# Patient Record
Sex: Male | Born: 1980
Health system: Southern US, Community
[De-identification: ages and names within clinical notes are randomized; demographics above are authoritative.]

## PROBLEM LIST (undated history)

## (undated) DIAGNOSIS — M1990 Unspecified osteoarthritis, unspecified site: Secondary | ICD-10-CM

## (undated) DIAGNOSIS — E785 Hyperlipidemia, unspecified: Secondary | ICD-10-CM

## (undated) DIAGNOSIS — I10 Essential (primary) hypertension: Secondary | ICD-10-CM

## (undated) DIAGNOSIS — L709 Acne, unspecified: Secondary | ICD-10-CM

## (undated) HISTORY — PX: ANKLE SURGERY: SHX546

## (undated) HISTORY — DX: Essential (primary) hypertension: I10

## (undated) HISTORY — DX: Hyperlipidemia, unspecified: E78.5

## (undated) HISTORY — DX: Acne, unspecified: L70.9

---
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
## 2003-12-07 ENCOUNTER — Emergency Department (HOSPITAL_COMMUNITY): Admission: EM | Admit: 2003-12-07 | Discharge: 2003-12-07 | Payer: Self-pay | Admitting: Emergency Medicine

## 2008-05-17 ENCOUNTER — Emergency Department (HOSPITAL_COMMUNITY): Admission: EM | Admit: 2008-05-17 | Discharge: 2008-05-17 | Payer: Self-pay | Admitting: Emergency Medicine

## 2012-10-23 ENCOUNTER — Emergency Department (HOSPITAL_COMMUNITY)
Admission: EM | Admit: 2012-10-23 | Discharge: 2012-10-23 | Disposition: A | Discharge status: Home / Self Care | Payer: BC Managed Care – PPO | Attending: Emergency Medicine | Admitting: Emergency Medicine

## 2012-10-23 ENCOUNTER — Encounter (HOSPITAL_COMMUNITY): Payer: Self-pay | Admitting: *Deleted

## 2012-10-23 DIAGNOSIS — IMO0002 Reserved for concepts with insufficient information to code with codable children: Secondary | ICD-10-CM | POA: Insufficient documentation

## 2012-10-23 DIAGNOSIS — F17200 Nicotine dependence, unspecified, uncomplicated: Secondary | ICD-10-CM | POA: Insufficient documentation

## 2012-10-23 DIAGNOSIS — E669 Obesity, unspecified: Secondary | ICD-10-CM | POA: Insufficient documentation

## 2012-10-23 DIAGNOSIS — Z8739 Personal history of other diseases of the musculoskeletal system and connective tissue: Secondary | ICD-10-CM | POA: Insufficient documentation

## 2012-10-23 DIAGNOSIS — M5416 Radiculopathy, lumbar region: Secondary | ICD-10-CM

## 2012-10-23 HISTORY — DX: Unspecified osteoarthritis, unspecified site: M19.90

## 2012-10-23 MED ORDER — CYCLOBENZAPRINE HCL 10 MG PO TABS: 10.0000 mg | ORAL_TABLET | Freq: Two times a day (BID) | ORAL | Status: DC | PRN

## 2012-10-23 MED ORDER — TRAMADOL HCL 50 MG PO TABS: 50.0000 mg | ORAL_TABLET | Freq: Four times a day (QID) | ORAL | Status: DC | PRN

## 2012-10-23 MED ORDER — NAPROXEN 500 MG PO TABS: 500.0000 mg | ORAL_TABLET | Freq: Two times a day (BID) | ORAL | Status: DC

## 2012-10-23 NOTE — ED Notes (Signed)
Pt states Thursday at work started having lower back pain radiating down to R thigh, pt states he does a lot of bending at work and thinks it triggered it, denies injury though, states fine over weekend then when went back to work today started having same pain.

## 2012-10-23 NOTE — ED Provider Notes (Signed)
History    This chart was scribed for non-physician practitioner, Lottie Mussel PA-C working with Derwood Kaplan, MD by Donne Anon, ED Scribe. This patient was seen in room WTR9/WTR9 and the patient's care was started at 1527.  CSN: [id number] Arrival date & time 10/23/12  1507  First MD Initiated Contact with Patient 10/23/12 1527     Chief Complaint  Patient presents with  . Back Pain    The history is provided by the patient. No language interpreter was used.   HPI Comments: Derek Perez is a 32 y.o. male who presents to the Emergency Department complaining of 5 days of gradual onset, gradually worsening, intermittent lower back pain described as aching and radiating down the top of his right thigh. He states it began while he was bending and twisting at work. He states the pain subsided over the weekend, and then worsened today when he went back to work. The pain is worse with walking and certain positions. Pt denies taking OTC medications at home to improve symptoms. Pt packs and lifts 5 lb boxes for work. He denies incontinence, numbness or weakness in legs, abdominal pain, fever, dysuria, hematouria or any other pain.    Past Medical History  Diagnosis Date  . Arthritis    Past Surgical History  Procedure Laterality Date  . Ankle surgery      right   No family history on file. History  Substance Use Topics  . Smoking status: Current Every Day Smoker  . Smokeless tobacco: Never Used  . Alcohol Use: No    Review of Systems  Gastrointestinal: Negative for abdominal pain.  Genitourinary: Negative for dysuria and hematuria.  Musculoskeletal: Positive for back pain.  Neurological: Negative for weakness and numbness.  All other systems reviewed and are negative.    Allergies  Review of patient's allergies indicates no known allergies.  Home Medications   Current Outpatient Rx  Name  Route  Sig  Dispense  Refill  . ibuprofen (ADVIL,MOTRIN) 200 MG  tablet   Oral   Take 200 mg by mouth every 6 (six) hours as needed for pain.          BP 152/97  Pulse 102  Temp(Src) 98.2 F (36.8 C) (Oral)  Resp 18  Ht 6\' 2"  (1.88 m)  Wt 300 lb (136.079 kg)  BMI 38.5 kg/m2  SpO2 99%  Physical Exam  Nursing note and vitals reviewed. Constitutional: He appears well-developed and well-nourished. No distress.  Pt is obese  HENT:  Head: Normocephalic and atraumatic.  Eyes: Conjunctivae are normal.  Neck: Neck supple. No tracheal deviation present.  Cardiovascular: Normal rate.   Pulmonary/Chest: Effort normal. No respiratory distress.  Abdominal: Soft. He exhibits no distension. There is no tenderness.  Musculoskeletal: Normal range of motion.  No midline or paravertebral tenderness over lumbar spine. Negative straight leg raise. Normal reflexes. Normal dorsiflexion with feet bilaterally. 5/5 strength in lower extremities.    Neurological: He is alert.  Skin: Skin is warm and dry.  Psychiatric: He has a normal mood and affect. His behavior is normal.    ED Course  Procedures (including critical care time) DIAGNOSTIC STUDIES: Oxygen Saturation is 99% on RA, normal by my interpretation.    COORDINATION OF CARE: 4:28 PM Discussed treatment plan which includes rest, muscle with pt at bedside and pt agreed to plan. Advised pt to follow up in 1 week with orthopedist if symptoms have not improved. Will give note for work.  Labs Reviewed - No data to display No results found.   1. Lumbar radiculopathy     MDM  Pt with lower back pain after lifting boxes at work. No prior back problems. No neuro deficits. No signs of cauda equina. No loss of bowels or urine. No feve,r no abdominal pain. Imaging not indicated at this time. Will start on naprosyn, flexeril, ultram. Follow up with PCP.   Filed Vitals:   10/23/12 1522  BP: 152/97  Pulse: 102  Temp: 98.2 F (36.8 C)  TempSrc: Oral  Resp: 18  Height: 6\' 2"  (1.88 m)  Weight: 300 lb  (136.079 kg)  SpO2: 99%     I personally performed the services described in this documentation, which was scribed in my presence. The recorded information has been reviewed and is accurate.    Lottie Mussel, PA-C 10/24/12 [phone]

## 2012-10-23 NOTE — Progress Notes (Signed)
   CARE MANAGEMENT ED NOTE 10/23/2012  Patient:  Derek Perez, Derek Perez   Account Number:  [id number]  Date Initiated:  10/23/2012  Documentation initiated by:  Radford Pax  Subjective/Objective Assessment:   Patient presented to ED with lower back pain.     Subjective/Objective Assessment Detail:     Action/Plan:   Action/Plan Detail:   Anticipated DC Date:  10/23/2012     Status Recommendation to Physician:   Result of Recommendation:    Other ED Services  Consult Working Plan    DC Planning Services  Other  PCP issues    Choice offered to / List presented to:            Status of service:  Completed, signed off  ED Comments:   ED Comments Detail:  Patient listed as not having a pcp.  EDCM spoke to patient who stated that he sees the physicians at [facility], but does not see a particular doctor there.   Nofurther needs at this time.

## 2012-10-24 NOTE — ED Provider Notes (Signed)
Medical screening examination/treatment/procedure(s) were performed by non-physician practitioner and as supervising physician I was immediately available for consultation/collaboration.  Derwood Kaplan, MD 10/24/12 [phone]

## 2013-03-13 ENCOUNTER — Ambulatory Visit (INDEPENDENT_AMBULATORY_CARE_PROVIDER_SITE_OTHER): Payer: BC Managed Care – PPO | Admitting: Family Medicine

## 2013-03-13 ENCOUNTER — Encounter: Payer: Self-pay | Admitting: Family Medicine

## 2013-03-13 VITALS — BP 139/87 | HR 80 | Temp 97.7°F | Ht 76.0 in | Wt 328.7 lb

## 2013-03-13 DIAGNOSIS — Z23 Encounter for immunization: Secondary | ICD-10-CM

## 2013-03-13 DIAGNOSIS — Z0000 Encounter for general adult medical examination without abnormal findings: Secondary | ICD-10-CM

## 2013-03-13 LAB — POCT CBC
Granulocyte percent: 59.7 %G (ref 37–80)
HCT, POC: 43.7 % (ref 43.5–53.7)
Hemoglobin: 14.4 g/dL (ref 14.1–18.1)
Lymph, poc: 2.7 (ref 0.6–3.4)
MCH, POC: 27.6 pg (ref 27–31.2)
MCHC: 33 g/dL (ref 31.8–35.4)
MCV: 83.7 fL (ref 80–97)
MPV: 9 fL (ref 0–99.8)
POC Granulocyte: 4.2 (ref 2–6.9)
POC LYMPH PERCENT: 37.7 %L (ref 10–50)
Platelet Count, POC: 165 10*3/uL (ref 142–424)
RBC: 5.2 M/uL (ref 4.69–6.13)
RDW, POC: 14.1 %
WBC: 7.1 10*3/uL (ref 4.6–10.2)

## 2013-03-13 NOTE — Progress Notes (Signed)
  Subjective:    Patient ID: Derek Perez, male    DOB: 02-19-1981, 32 y.o.   MRN: [id number]  HPI This 32 y.o. male presents for evaluation of CPE.  He has no acute complaints. He is getting this CPE on behest of his workplace which is now required and he Needs a form filled out showing he has had his CPE.   Review of Systems No chest pain, SOB, HA, dizziness, vision change, N/V, diarrhea, constipation, dysuria, urinary urgency or frequency, myalgias, arthralgias or rash.     Objective:   Physical Exam Vital signs noted  Well developed well nourished male.  HEENT - Head atraumatic Normocephalic                Eyes - PERRLA, Conjuctiva - clear Sclera- Clear EOMI                Ears - EAC's Wnl TM's Wnl Gross Hearing WNL                Nose - Nares patent                 Throat - oropharanx wnl Respiratory - Lungs CTA bilateral Cardiac - RRR S1 and S2 without murmur GI - Abdomen soft Nontender and bowel sounds active x 4 Extremities - No edema. Neuro - Grossly intact.       Assessment & Plan:  Routine general medical examination at a health care facility - Plan: POCT CBC, CMP14+EGFR, Lipid panel, Thyroid Panel With TSH Discussed weight loss efforts and discussed taking MVI po qd.  Discussed getting exercise and Eating well balanced and lower caloric diet.  Deatra Canter FNP

## 2013-03-13 NOTE — Patient Instructions (Signed)
Calorie Counting Diet A calorie counting diet requires you to eat the number of calories that are right for you in a day. Calories are the measurement of how much energy you get from the food you eat. Eating the right amount of calories is important for staying at a healthy weight. If you eat too many calories, your body will store them as fat and you may gain weight. If you eat too few calories, you may lose weight. Counting the number of calories you eat during a day will help you know if you are eating the right amount. A Registered Dietitian can determine how many calories you need in a day. The amount of calories needed varies from person to person. If your goal is to lose weight, you will need to eat fewer calories. Losing weight can benefit you if you are overweight or have health problems such as heart disease, high blood pressure, or diabetes. If your goal is to gain weight, you will need to eat more calories. Gaining weight may be necessary if you have a certain health problem that causes your body to need more energy. TIPS Whether you are increasing or decreasing the number of calories you eat during a day, it may be hard to get used to changes in what you eat and drink. The following are tips to help you keep track of the number of calories you eat.  Measure foods at home with measuring cups. This helps you know the amount of food and number of calories you are eating.  Restaurants often serve food in amounts that are larger than 1 serving. While eating out, estimate how many servings of a food you are given. For example, a serving of cooked rice is  cup or about the size of half of a fist. Knowing serving sizes will help you be aware of how much food you are eating at restaurants.  Ask for smaller portion sizes or child-size portions at restaurants.  Plan to eat half of a meal at a restaurant. Take the rest home or share the other half with a friend.  Read the Nutrition Facts panel on  food labels for calorie content and serving size. You can find out how many servings are in a package, the size of a serving, and the number of calories each serving has.  For example, a package might contain 3 cookies. The Nutrition Facts panel on that package says that 1 serving is 1 cookie. Below that, it will say there are 3 servings in the container. The calories section of the Nutrition Facts label says there are 90 calories. This means there are 90 calories in 1 cookie (1 serving). If you eat 1 cookie you have eaten 90 calories. If you eat all 3 cookies, you have eaten 270 calories (3 servings x 90 calories = 270 calories). The list below tells you how big or small some common portion sizes are.  1 oz.........4 stacked dice.  3 oz.........Deck of cards.  1 tsp........Tip of little finger.  1 tbs........Thumb.  2 tbs........Golf ball.   cup.......Half of a fist.  1 cup........A fist. KEEP A FOOD LOG Write down every food item you eat, the amount you eat, and the number of calories in each food you eat during the day. At the end of the day, you can add up the total number of calories you have eaten. It may help to keep a list like the one below. Find out the calorie information by reading the   Nutrition Facts panel on food labels. Breakfast  Bran cereal (1 cup, 110 calories).  Fat-free milk ( cup, 45 calories). Snack  Apple (1 medium, 80 calories). Lunch  Spinach (1 cup, 20 calories).  Tomato ( medium, 20 calories).  Chicken breast strips (3 oz, 165 calories).  Shredded cheddar cheese ( cup, 110 calories).  Light Italian dressing (2 tbs, 60 calories).  Whole-wheat bread (1 slice, 80 calories).  Tub margarine (1 tsp, 35 calories).  Vegetable soup (1 cup, 160 calories). Dinner  Pork chop (3 oz, 190 calories).  Brown rice (1 cup, 215 calories).  Steamed broccoli ( cup, 20 calories).  Strawberries (1  cup, 65 calories).  Whipped cream (1 tbs, 50  calories). Daily Calorie Total: 1425 Document Released: 04/19/2005 Document Revised: 07/12/2011 Document Reviewed: 10/14/2006 [employer] Patient Information 2014 [employer].  

## 2013-03-14 LAB — CMP14+EGFR
ALT: 19 IU/L (ref 0–44)
AST: 23 IU/L (ref 0–40)
Albumin/Globulin Ratio: 1.8 (ref 1.1–2.5)
Albumin: 4.2 g/dL (ref 3.5–5.5)
Alkaline Phosphatase: 63 IU/L (ref 39–117)
BUN/Creatinine Ratio: 15 (ref 8–19)
BUN: 15 mg/dL (ref 6–20)
CO2: 27 mmol/L (ref 18–29)
Calcium: 9.1 mg/dL (ref 8.7–10.2)
Chloride: 105 mmol/L (ref 97–108)
Creatinine, Ser: 1.03 mg/dL (ref 0.76–1.27)
GFR calc Af Amer: 110 mL/min/{1.73_m2} (ref >59)
GFR calc non Af Amer: 95 mL/min/{1.73_m2} (ref >59)
Globulin, Total: 2.3 g/dL (ref 1.5–4.5)
Glucose: 93 mg/dL (ref 65–99)
Potassium: 4.9 mmol/L (ref 3.5–5.2)
Sodium: 144 mmol/L (ref 134–144)
Total Bilirubin: 0.3 mg/dL (ref 0.0–1.2)
Total Protein: 6.5 g/dL (ref 6.0–8.5)

## 2013-03-14 LAB — LIPID PANEL
Chol/HDL Ratio: 3.8 ratio units (ref 0.0–5.0)
Cholesterol, Total: 193 mg/dL (ref 100–199)
HDL: 51 mg/dL (ref >39)
LDL Calculated: 125 mg/dL — ABNORMAL HIGH (ref 0–99)
Triglycerides: 85 mg/dL (ref 0–149)
VLDL Cholesterol Cal: 17 mg/dL (ref 5–40)

## 2013-03-14 LAB — THYROID PANEL WITH TSH
Free Thyroxine Index: 3.2 (ref 1.2–4.9)
T3 Uptake Ratio: 30 % (ref 24–39)
T4, Total: 10.7 ug/dL (ref 4.5–12.0)
TSH: 2 u[IU]/mL (ref 0.450–4.500)

## 2013-08-20 ENCOUNTER — Telehealth: Payer: Self-pay | Admitting: Family Medicine

## 2013-08-20 NOTE — Telephone Encounter (Signed)
appt at 2:45 with Ander SladeBill Oxford

## 2013-08-21 ENCOUNTER — Ambulatory Visit: Payer: BC Managed Care – PPO | Admitting: Family Medicine

## 2014-05-30 ENCOUNTER — Ambulatory Visit (INDEPENDENT_AMBULATORY_CARE_PROVIDER_SITE_OTHER): Payer: BLUE CROSS/BLUE SHIELD | Admitting: Family Medicine

## 2014-05-30 ENCOUNTER — Encounter: Payer: Self-pay | Admitting: Family Medicine

## 2014-05-30 VITALS — BP 156/105 | HR 106 | Temp 97.7°F | Ht 76.0 in | Wt 320.0 lb

## 2014-05-30 DIAGNOSIS — K529 Noninfective gastroenteritis and colitis, unspecified: Secondary | ICD-10-CM

## 2014-05-30 MED ORDER — HYOSCYAMINE SULFATE 0.125 MG SL SUBL: 0.1250 mg | SUBLINGUAL_TABLET | SUBLINGUAL | Status: DC | PRN

## 2014-05-30 NOTE — Progress Notes (Signed)
   Subjective:    Patient ID: Derek Perez, male    DOB: 11/27/1980, 34 y.o.   MRN: [id number]  HPI Patient c/o diarrhea and bowel cramps  Review of Systems  Constitutional: Negative for fever.  HENT: Negative for ear pain.   Eyes: Negative for discharge.  Respiratory: Negative for cough.   Cardiovascular: Negative for chest pain.  Gastrointestinal: Negative for abdominal distention.  Endocrine: Negative for polyuria.  Genitourinary: Negative for difficulty urinating.  Musculoskeletal: Negative for gait problem and neck pain.  Skin: Negative for color change and rash.  Neurological: Negative for speech difficulty and headaches.  Psychiatric/Behavioral: Negative for agitation.       Objective:    BP 156/105 mmHg  Pulse 106  Temp(Src) 97.7 F (36.5 C) (Oral)  Ht 6\' 4"  (1.93 m)  Wt 320 lb (145.151 kg)  BMI 38.97 kg/m2 Physical Exam  Constitutional: He is oriented to person, place, and time. He appears well-developed and well-nourished.  HENT:  Head: Normocephalic and atraumatic.  Mouth/Throat: Oropharynx is clear and moist.  Neck: Normal range of motion. Neck supple.  Cardiovascular: Normal rate and regular rhythm.   No murmur heard. Pulmonary/Chest: Effort normal and breath sounds normal.  Abdominal: Soft. Bowel sounds are normal. There is no tenderness.  Neurological: He is alert and oriented to person, place, and time.  Skin: Skin is warm and dry.  Psychiatric: He has a normal mood and affect.          Assessment & Plan:     ICD-9-CM ICD-10-CM   1. Noninfectious gastroenteritis, unspecified 558.9 K52.9 hyoscyamine (LEVSIN/SL) 0.125 MG SL tablet     No Follow-up on file.  Deatra CanterWilliam J Oxford FNP

## 2014-06-13 ENCOUNTER — Ambulatory Visit (INDEPENDENT_AMBULATORY_CARE_PROVIDER_SITE_OTHER): Payer: BLUE CROSS/BLUE SHIELD | Admitting: Family Medicine

## 2014-06-13 ENCOUNTER — Encounter: Payer: Self-pay | Admitting: Family Medicine

## 2014-06-13 VITALS — BP 139/94 | HR 90 | Temp 98.2°F | Ht 76.0 in | Wt 316.0 lb

## 2014-06-13 DIAGNOSIS — R197 Diarrhea, unspecified: Secondary | ICD-10-CM

## 2014-06-13 MED ORDER — PANTOPRAZOLE SODIUM 40 MG PO TBEC: 40.0000 mg | DELAYED_RELEASE_TABLET | Freq: Every day | ORAL | Status: DC

## 2014-06-13 MED ORDER — METRONIDAZOLE 500 MG PO TABS: 500.0000 mg | ORAL_TABLET | Freq: Three times a day (TID) | ORAL | Status: DC

## 2014-06-13 NOTE — Progress Notes (Signed)
   Subjective:  Patient ID: Derek Perez, male    DOB: 12/27/1980  Age: 34 y.o. MRN: [id number]  CC: Abdominal Pain   HPI Derek Perez presents for intermittent diarrhea with stomach pain and gurgling.BMs are slimey. 2-3 per hour.onset 3 weeks ago.Somewhat better until yesterday. There has been no nausea or vomiting. There is no association with food of any type. He denies any melena or hematochezia. He points to the right upper quadrant to the periumbilical region as the point of maximal discomfort. Pain severity is 5-7/10 described as a cramping.  History Derek Perez has a past medical history of Arthritis.   He has past surgical history that includes Ankle surgery.   His family history is not on file.He reports that he has been smoking.  He has never used smokeless tobacco. He reports that he does not drink alcohol or use illicit drugs.  No current outpatient prescriptions on file prior to visit.   No current facility-administered medications on file prior to visit.    ROS Review of Systems  Objective:  BP 139/94 mmHg  Pulse 90  Temp(Src) 98.2 F (36.8 C) (Oral)  Ht 6\' 4"  (1.93 m)  Wt 316 lb (143.337 kg)  BMI 38.48 kg/m2  BP Readings from Last 3 Encounters:  06/13/14 139/94  05/30/14 156/105  03/13/13 139/87    Wt Readings from Last 3 Encounters:  06/13/14 316 lb (143.337 kg)  05/30/14 320 lb (145.151 kg)  03/13/13 328 lb 11.2 oz (149.097 kg)     Physical Exam  No results found for: HGBA1C  Lab Results  Component Value Date   WBC 7.1 03/13/2013   HGB 14.4 03/13/2013   HCT 43.7 03/13/2013   GLUCOSE 93 03/13/2013   TRIG 85 03/13/2013   HDL 51 03/13/2013   LDLCALC 125* 03/13/2013   ALT 19 03/13/2013   AST 23 03/13/2013   NA 144 03/13/2013   K 4.9 03/13/2013   CL 105 03/13/2013   CREATININE 1.03 03/13/2013   BUN 15 03/13/2013   CO2 27 03/13/2013   TSH 2.000 03/13/2013    No results found.  Assessment & Plan:   Derek Perez was seen today for  abdominal pain.  Diagnoses and all orders for this visit:  Diarrhea Orders: -     Stool culture -     Clostridium difficile EIA -     GI pathogen panel by PCR, stool  Other orders -     metroNIDAZOLE (FLAGYL) 500 MG tablet; Take 1 tablet (500 mg total) by mouth 3 (three) times daily. For infection. Take the entire course -     pantoprazole (PROTONIX) 40 MG tablet; Take 1 tablet (40 mg total) by mouth daily. For stomach   I have discontinued Derek Perez's hyoscyamine. I am also having him start on metroNIDAZOLE and pantoprazole.  Meds ordered this encounter  Medications  . metroNIDAZOLE (FLAGYL) 500 MG tablet    Sig: Take 1 tablet (500 mg total) by mouth 3 (three) times daily. For infection. Take the entire course    Dispense:  30 tablet    Refill:  0  . pantoprazole (PROTONIX) 40 MG tablet    Sig: Take 1 tablet (40 mg total) by mouth daily. For stomach    Dispense:  30 tablet    Refill:  3     Follow-up: Return if symptoms worsen or fail to improve.  Mechele ClaudeWarren Titus Drone, M.D.

## 2014-06-14 ENCOUNTER — Other Ambulatory Visit: Payer: BLUE CROSS/BLUE SHIELD

## 2014-06-14 NOTE — Progress Notes (Signed)
Lab only 

## 2014-06-14 NOTE — Addendum Note (Signed)
Addended by: Tommas OlpHANDY, Helem Reesor N on: 06/14/2014 02:51 PM   Modules accepted: Orders

## 2014-06-14 NOTE — Addendum Note (Signed)
Addended by: Tommas OlpHANDY, Salaam Battershell N on: 06/14/2014 02:15 PM   Modules accepted: Orders

## 2014-06-15 ENCOUNTER — Emergency Department (HOSPITAL_COMMUNITY)
Admission: EM | Admit: 2014-06-15 | Discharge: 2014-06-15 | Disposition: A | Discharge status: Home / Self Care | Payer: BLUE CROSS/BLUE SHIELD | Attending: Emergency Medicine | Admitting: Emergency Medicine

## 2014-06-15 ENCOUNTER — Encounter (HOSPITAL_COMMUNITY): Payer: Self-pay | Admitting: Emergency Medicine

## 2014-06-15 ENCOUNTER — Emergency Department (HOSPITAL_COMMUNITY): Payer: BLUE CROSS/BLUE SHIELD

## 2014-06-15 DIAGNOSIS — K529 Noninfective gastroenteritis and colitis, unspecified: Secondary | ICD-10-CM

## 2014-06-15 DIAGNOSIS — Z720 Tobacco use: Secondary | ICD-10-CM | POA: Insufficient documentation

## 2014-06-15 DIAGNOSIS — R109 Unspecified abdominal pain: Secondary | ICD-10-CM | POA: Diagnosis present

## 2014-06-15 DIAGNOSIS — Z79899 Other long term (current) drug therapy: Secondary | ICD-10-CM | POA: Insufficient documentation

## 2014-06-15 DIAGNOSIS — Z8739 Personal history of other diseases of the musculoskeletal system and connective tissue: Secondary | ICD-10-CM | POA: Insufficient documentation

## 2014-06-15 LAB — COMPREHENSIVE METABOLIC PANEL
ALK PHOS: 54 U/L (ref 39–117)
ALT: 17 U/L (ref 0–53)
ANION GAP: 5 (ref 5–15)
AST: 16 U/L (ref 0–37)
Albumin: 3.7 g/dL (ref 3.5–5.2)
BILIRUBIN TOTAL: 0.8 mg/dL (ref 0.3–1.2)
BUN: 11 mg/dL (ref 6–23)
CHLORIDE: 105 mmol/L (ref 96–112)
CO2: 30 mmol/L (ref 19–32)
CREATININE: 1.26 mg/dL (ref 0.50–1.35)
Calcium: 9.1 mg/dL (ref 8.4–10.5)
GFR calc Af Amer: 84 mL/min — ABNORMAL LOW (ref >90)
GFR, EST NON AFRICAN AMERICAN: 73 mL/min — AB (ref >90)
Glucose, Bld: 102 mg/dL — ABNORMAL HIGH (ref 70–99)
Potassium: 3.6 mmol/L (ref 3.5–5.1)
Sodium: 140 mmol/L (ref 135–145)
Total Protein: 7 g/dL (ref 6.0–8.3)

## 2014-06-15 LAB — CBC WITH DIFFERENTIAL/PLATELET
Basophils Absolute: 0 10*3/uL (ref 0.0–0.1)
Basophils Relative: 0 % (ref 0–1)
EOS ABS: 0.1 10*3/uL (ref 0.0–0.7)
EOS PCT: 1 % (ref 0–5)
HCT: 47.7 % (ref 39.0–52.0)
HEMOGLOBIN: 15.4 g/dL (ref 13.0–17.0)
LYMPHS PCT: 12 % (ref 12–46)
Lymphs Abs: 1.5 10*3/uL (ref 0.7–4.0)
MCH: 27.6 pg (ref 26.0–34.0)
MCHC: 32.3 g/dL (ref 30.0–36.0)
MCV: 85.6 fL (ref 78.0–100.0)
MONOS PCT: 11 % (ref 3–12)
Monocytes Absolute: 1.3 10*3/uL — ABNORMAL HIGH (ref 0.1–1.0)
NEUTROS PCT: 76 % (ref 43–77)
Neutro Abs: 9.2 10*3/uL — ABNORMAL HIGH (ref 1.7–7.7)
Platelets: 222 10*3/uL (ref 150–400)
RBC: 5.57 MIL/uL (ref 4.22–5.81)
RDW: 13.5 % (ref 11.5–15.5)
WBC: 12.1 10*3/uL — ABNORMAL HIGH (ref 4.0–10.5)

## 2014-06-15 LAB — LIPASE, BLOOD: LIPASE: 16 U/L (ref 11–59)

## 2014-06-15 MED ORDER — HYDROCODONE-ACETAMINOPHEN 5-325 MG PO TABS: 1.0000 | ORAL_TABLET | ORAL | Status: DC | PRN

## 2014-06-15 MED ORDER — CIPROFLOXACIN HCL 500 MG PO TABS: 500.0000 mg | ORAL_TABLET | Freq: Two times a day (BID) | ORAL | Status: DC

## 2014-06-15 MED ORDER — IOHEXOL 300 MG/ML  SOLN
100.0000 mL | Freq: Once | INTRAMUSCULAR | Status: AC | PRN
  Administered 2014-06-15: 100 mL via INTRAVENOUS

## 2014-06-15 MED ORDER — HYDROCODONE-ACETAMINOPHEN 5-325 MG PO TABS
1.0000 | ORAL_TABLET | Freq: Once | ORAL | Status: AC
  Administered 2014-06-15: 1 via ORAL
  Filled 2014-06-15: qty 1

## 2014-06-15 MED ORDER — IOHEXOL 300 MG/ML  SOLN
50.0000 mL | Freq: Once | INTRAMUSCULAR | Status: AC | PRN
  Administered 2014-06-15: 50 mL via ORAL

## 2014-06-15 MED ORDER — DIPHENOXYLATE-ATROPINE 2.5-0.025 MG PO TABS
2.0000 | ORAL_TABLET | Freq: Once | ORAL | Status: AC
  Administered 2014-06-15: 2 via ORAL
  Filled 2014-06-15: qty 2

## 2014-06-15 MED ORDER — DIPHENOXYLATE-ATROPINE 2.5-0.025 MG PO TABS: 1.0000 | ORAL_TABLET | Freq: Four times a day (QID) | ORAL | Status: DC | PRN

## 2014-06-15 NOTE — ED Notes (Signed)
Pt reports nausea, diarrhea and abd cramping for past 3 weeks. No emesis, reports 10 episodes of diarrhea a day, "looks slimy at times." Cramping is all across abd most of the time, but sometimes worse in RLQ. No fever at home.

## 2014-06-15 NOTE — ED Provider Notes (Signed)
CSN: [id number]     Arrival date & time 06/15/14  1609 History   First MD Initiated Contact with Patient 06/15/14 1650     Chief Complaint  Patient presents with  . Diarrhea  . Abdominal Cramping     (Consider location/radiation/quality/duration/timing/severity/associated sxs/prior Treatment) HPI Derek Perez is a 34 y.o. male with hx of arthritis, presents to ED with complaint of abdominal cramping and diarrhea for 3 weeks. States no hx of the same. When his symptoms began, his oldest daughter had diarrhea as well. States diarrhea is daily, about 10 episodes a day, watery, brown, at times "slimy." Denies blood in his stool. Denies black or tarry stools. Admits to nausea, no vomiting. Pain is generalized, states "feels crampy, and gurgling." Pt went to his PCP twice for this. First time, was told he has viral infection states last time he went 2 days ago, had stool cultures done, and culture for c diff, and was given flagyl and protonix. States has been taking both with no improvement. Pt states he had to take his wifes zofran and norco for his nausea and pain today.   Past Medical History  Diagnosis Date  . Arthritis    Past Surgical History  Procedure Laterality Date  . Ankle surgery      right   History reviewed. No pertinent family history. History  Substance Use Topics  . Smoking status: Current Every Day Smoker  . Smokeless tobacco: Never Used  . Alcohol Use: No    Review of Systems  Constitutional: Negative for fever and chills.  Respiratory: Negative for cough, chest tightness and shortness of breath.   Cardiovascular: Negative for chest pain, palpitations and leg swelling.  Gastrointestinal: Positive for nausea, abdominal pain and diarrhea. Negative for vomiting, blood in stool and abdominal distention.  Genitourinary: Negative for dysuria, urgency, frequency and hematuria.  Musculoskeletal: Negative for myalgias, arthralgias, neck pain and neck stiffness.  Skin:  Negative for rash.  Allergic/Immunologic: Negative for immunocompromised state.  Neurological: Negative for dizziness, weakness, light-headedness, numbness and headaches.  All other systems reviewed and are negative.     Allergies  Review of patient's allergies indicates no known allergies.  Home Medications   Prior to Admission medications   Medication Sig Start Date End Date Taking? Authorizing Provider  metroNIDAZOLE (FLAGYL) 500 MG tablet Take 1 tablet (500 mg total) by mouth 3 (three) times daily. For infection. Take the entire course 06/13/14  Yes Mechele ClaudeWarren Stacks, MD  ondansetron (ZOFRAN) 4 MG tablet Take 4 mg by mouth once.   Yes Historical Provider, MD  pantoprazole (PROTONIX) 40 MG tablet Take 1 tablet (40 mg total) by mouth daily. For stomach 06/13/14  Yes Mechele ClaudeWarren Stacks, MD   BP 133/83 mmHg  Pulse 95  Temp(Src) 98.5 F (36.9 C) (Oral)  Resp 16  SpO2 97% Physical Exam  Constitutional: He appears well-developed and well-nourished. No distress.  HENT:  Head: Normocephalic and atraumatic.  Eyes: Conjunctivae are normal.  Neck: Neck supple.  Cardiovascular: Normal rate, regular rhythm and normal heart sounds.   Pulmonary/Chest: Effort normal. No respiratory distress. He has no wheezes. He has no rales.  Abdominal: Soft. Bowel sounds are normal. He exhibits no distension. There is tenderness. There is no rebound.  RLQ, LLQ tederness  Musculoskeletal: He exhibits no edema.  Neurological: He is alert.  Skin: Skin is warm and dry.  Nursing note and vitals reviewed.   ED Course  Procedures (including critical care time) Labs Review Labs Reviewed  CBC WITH  DIFFERENTIAL/PLATELET - Abnormal; Notable for the following:    WBC 12.1 (*)    Neutro Abs 9.2 (*)    Monocytes Absolute 1.3 (*)    All other components within normal limits  COMPREHENSIVE METABOLIC PANEL - Abnormal; Notable for the following:    Glucose, Bld 102 (*)    GFR calc non Af Amer 73 (*)    GFR calc Af Amer  84 (*)    All other components within normal limits  LIPASE, BLOOD    Imaging Review Ct Abdomen Pelvis W Contrast  06/15/2014   CLINICAL DATA:  Three-week history of diarrhea and cramping.  EXAM: CT ABDOMEN AND PELVIS WITH CONTRAST  TECHNIQUE: Multidetector CT imaging of the abdomen and pelvis was performed using the standard protocol following bolus administration of intravenous contrast.  CONTRAST:  50mL OMNIPAQUE IOHEXOL 300 MG/ML SOLN, OMNIPAQUE IOHEXOL 300 MG/ML SOLN  COMPARISON:  None.  FINDINGS: There is mural thickening and edema throughout the colon with appearances typical of colitis. The changes do not conform to a vascular territory to suggest ischemic colitis. This most likely as an infectious or inflammatory colitis and involves the entire colon. There is no obstruction. There is no free intraperitoneal air. There is no significant abnormality of the small bowel. There is no significant diverticular disease.  The liver, gallbladder and bile ducts appear normal. The pancreas appears normal. The spleen appears normal. The adrenals and kidneys appear normal.  There is a very small volume ascites collected in the dependent pelvic peritoneum.  Included portions of the lower chest are remarkable only for mild linear posterior base atelectasis or scarring.  Incidental note is made of mild degenerative appearing retrolisthesis of L4 on L5. No significant skeletal lesions are evident.  IMPRESSION: 1. Abnormal mural thickening and edema throughout the colon consistent with colitis 2. Negative for obstruction, perforation or evidence of ischemia   Electronically Signed   By: Ellery Plunk M.D.   On: 06/15/2014 19:31     EKG Interpretation None      MDM   Final diagnoses:  Colitis   Pt with ongoing diarrhea for 3 wks. Abdominal pain. Looks like stool cultures, ova and parasites, c diff already ordered, pending in Epic. Pt does not appear to be dehydrated. No peritoneal signs on exam.  Will check cbc, cmp, lipase. Lomotil and norco ordered.    7:54 PM Given diarrhea for 3 wks, abdominal pain, CT scan abd/pelvis obtained showing colitis. Will add cipro. norco for pain. Lomotil for diarrhea. Will d/c home with outpatient follow up. Pt is non toxic. No vomiting. VS normal.   Filed Vitals:   06/15/14 1630 06/15/14 1851  BP: 133/83 109/92  Pulse: 95 81  Temp: 98.5 F (36.9 C) 97.5 F (36.4 C)  TempSrc: Oral Oral  Resp: 16 16  SpO2: 97% 99%     Lottie Mussel, PA-C 06/15/14 1956  Vida Roller, MD 06/15/14 2134

## 2014-06-15 NOTE — ED Provider Notes (Signed)
34 year old male, history of 3 weeks of watery diarrhea, presents with abdominal cramping and persistent diarrhea. Saw his family doctor recently, started on metronidazole, no significant improvement. On exam the patient is obese, tenderness across the right upper abdomen, right midabdomen and epigastrium, no guarding, no peritoneal signs. Heart and lung sounds normal, the patient is not in distress, he has a slight leukocytosis and a CT scan has been performed to evaluate for the source of his abdominal pain.  The patient will likely need pain control, consider antibiotic changes as per CT scan, otherwise the patient is not critically ill and can likely be discharged home with medications and close follow-up.  Medical screening examination/treatment/procedure(s) were conducted as a shared visit with non-physician practitioner(s) and myself.  I personally evaluated the patient during the encounter.  Clinical Impression:   Final diagnoses:  Colitis         Vida RollerBrian D Coy Vandoren, MD 06/15/14 2134

## 2014-06-15 NOTE — Discharge Instructions (Signed)
Your CT shows colitis. Continue to take metronidazole. Start taking cipro in addition. Take norco as needed for pain. Lomotil for diarrhea. Follow up with your doctor regarding stool cultures and to make sure this improves. Return if worsening symptoms.    Colitis Colitis is inflammation of the colon. Colitis can be a short-term or long-standing (chronic) illness. Crohn's disease and ulcerative colitis are 2 types of colitis which are chronic. They usually require lifelong treatment. CAUSES  There are many different causes of colitis, including:  Viruses.  Germs (bacteria).  Medicine reactions. SYMPTOMS   Diarrhea.  Intestinal bleeding.  Pain.  Fever.  Throwing up (vomiting).  Tiredness (fatigue).  Weight loss.  Bowel blockage. DIAGNOSIS  The diagnosis of colitis is based on examination and stool or blood tests. X-rays, CT scan, and colonoscopy may also be needed. TREATMENT  Treatment may include:  Fluids given through the vein (intravenously).  Bowel rest (nothing to eat or drink for a period of time).  Medicine for pain and diarrhea.  Medicines (antibiotics) that kill germs.  Cortisone medicines.  Surgery. HOME CARE INSTRUCTIONS   Get plenty of rest.  Drink enough water and fluids to keep your urine clear or pale yellow.  Eat a well-balanced diet.  Call your caregiver for follow-up as recommended. SEEK IMMEDIATE MEDICAL CARE IF:   You develop chills.  You have an oral temperature above 102 F (38.9 C), not controlled by medicine.  You have extreme weakness, fainting, or dehydration.  You have repeated vomiting.  You develop severe belly (abdominal) pain or are passing bloody or tarry stools. MAKE SURE YOU:   Understand these instructions.  Will watch your condition.  Will get help right away if you are not doing well or get worse. Document Released: 05/27/2004 Document Revised: 07/12/2011 Document Reviewed: 08/22/2009 [facility] Patient  Information 2015 [place]. This information is not intended to replace advice given to you by your health care provider. Make sure you discuss any questions you have with your health care provider.

## 2014-06-15 NOTE — ED Notes (Signed)
PA at bedside.

## 2014-06-18 LAB — STOOL CULTURE: E COLI SHIGA TOXIN ASSAY: NEGATIVE

## 2014-06-18 LAB — OVA AND PARASITE EXAMINATION

## 2014-06-19 ENCOUNTER — Telehealth: Payer: Self-pay | Admitting: Family Medicine

## 2014-06-19 ENCOUNTER — Telehealth: Payer: Self-pay | Admitting: *Deleted

## 2014-06-19 LAB — CLOSTRIDIUM DIFFICILE EIA: C DIFFICILE TOXINS A+ B, EIA: POSITIVE — AB

## 2014-06-19 NOTE — Telephone Encounter (Signed)
-----   Message from Mechele ClaudeWarren Stacks, MD sent at 06/18/2014  4:44 PM EST ----- Silvestre GunnerHello Sergi,    Your stool lab result is normal.Some minor variations that are not significant are commonly marked abnormal, but do not represent any medical problem for you.  Best regards, Mechele ClaudeWarren Stacks, M.D.

## 2014-06-19 NOTE — Telephone Encounter (Signed)
Wife concerned about husband going back to work. Wants to know if its safe for him to return to work. Please advise. [place] [employer]. If ok will need a note stating that its ok for him to return

## 2014-06-19 NOTE — Telephone Encounter (Signed)
Patient aware of normal stool results.

## 2014-06-19 NOTE — Telephone Encounter (Signed)
HE can return to work when the diarrhea resolves

## 2014-06-20 NOTE — Telephone Encounter (Signed)
Patients wife notified and note given to be out of work for yesterday

## 2014-08-05 ENCOUNTER — Encounter: Payer: Self-pay | Admitting: Family Medicine

## 2014-08-05 ENCOUNTER — Ambulatory Visit (INDEPENDENT_AMBULATORY_CARE_PROVIDER_SITE_OTHER): Payer: BLUE CROSS/BLUE SHIELD | Admitting: Family Medicine

## 2014-08-05 VITALS — BP 142/88 | HR 99 | Temp 97.0°F | Ht 76.0 in | Wt 322.6 lb

## 2014-08-05 DIAGNOSIS — Z0000 Encounter for general adult medical examination without abnormal findings: Secondary | ICD-10-CM

## 2014-08-05 DIAGNOSIS — F17209 Nicotine dependence, unspecified, with unspecified nicotine-induced disorders: Secondary | ICD-10-CM

## 2014-08-05 DIAGNOSIS — Z716 Tobacco abuse counseling: Secondary | ICD-10-CM

## 2014-08-05 MED ORDER — VARENICLINE TARTRATE 0.5 MG X 11 & 1 MG X 42 PO MISC: ORAL | Status: DC

## 2014-08-05 NOTE — Progress Notes (Signed)
Subjective:  Patient ID: Derek Perez, male    DOB: 1980-08-24  Age: 34 y.o. MRN: [id number]  CC: Annual Exam   HPI Derek Perez presents for his annual exam  History Derek Perez has a past medical history of Arthritis.   He has past surgical history that includes Ankle surgery.   His family history is negative for Cancer, Heart disease, and Stroke.He reports that he has been smoking.  He has never used smokeless tobacco. He reports that he does not drink alcohol or use illicit drugs.  No current outpatient prescriptions on file prior to visit.   No current facility-administered medications on file prior to visit.    ROS Review of Systems  Constitutional: Negative for fever, chills, diaphoresis, activity change, appetite change, fatigue and unexpected weight change.  HENT: Negative for congestion, ear pain, hearing loss, postnasal drip, rhinorrhea, sore throat, tinnitus and trouble swallowing.   Eyes: Negative for photophobia, pain, discharge and redness.  Respiratory: Negative for apnea, cough, choking, chest tightness, shortness of breath, wheezing and stridor.   Cardiovascular: Negative for chest pain, palpitations and leg swelling.  Gastrointestinal: Negative for nausea, vomiting, abdominal pain, diarrhea, constipation, blood in stool and abdominal distention.  Endocrine: Negative for cold intolerance, heat intolerance, polydipsia, polyphagia and polyuria.  Genitourinary: Negative for dysuria, urgency, frequency, hematuria, flank pain, enuresis, difficulty urinating and genital sores.  Musculoskeletal: Negative for joint swelling and arthralgias.  Skin: Negative for color change, rash and wound.  Allergic/Immunologic: Negative for immunocompromised state.  Neurological: Negative for dizziness, tremors, seizures, syncope, facial asymmetry, speech difficulty, weakness, light-headedness, numbness and headaches.  Hematological: Does not bruise/bleed easily.    Psychiatric/Behavioral: Negative for suicidal ideas, hallucinations, behavioral problems, confusion, sleep disturbance, dysphoric mood, decreased concentration and agitation. The patient is not nervous/anxious and is not hyperactive.     Objective:  BP 142/88 mmHg  Pulse 99  Temp(Src) 97 F (36.1 C) (Oral)  Ht _0  (1.93 m)  Wt 322 lb 9.6 oz (146.33 kg)  BMI 39.28 kg/m2  BP Readings from Last 3 Encounters:  08/05/14 142/88  06/15/14 105/85  06/13/14 139/94    Wt Readings from Last 3 Encounters:  08/05/14 322 lb 9.6 oz (146.33 kg)  06/13/14 316 lb (143.337 kg)  05/30/14 320 lb (145.151 kg)     Physical Exam  Constitutional: He is oriented to person, place, and time. He appears well-developed and well-nourished.  HENT:  Head: Normocephalic and atraumatic.  Mouth/Throat: Oropharynx is clear and moist.  Eyes: EOM are normal. Pupils are equal, round, and reactive to light.  Neck: Normal range of motion. No tracheal deviation present. No thyromegaly present.  Cardiovascular: Normal rate, regular rhythm and normal heart sounds.  Exam reveals no gallop and no friction rub.   No murmur heard. Pulmonary/Chest: Breath sounds normal. He has no wheezes. He has no rales.  Abdominal: Soft. He exhibits no mass. There is no tenderness.  Musculoskeletal: Normal range of motion. He exhibits no edema.  Neurological: He is alert and oriented to person, place, and time.  Skin: Skin is warm and dry.  Psychiatric: He has a normal mood and affect.    No results found for: HGBA1C  Lab Results  Component Value Date   WBC 7.3 08/05/2014   HGB 14.4 08/05/2014   HCT 43.4 08/05/2014   PLT 166 08/05/2014   GLUCOSE 99 08/05/2014   CHOL 196 08/05/2014   TRIG 154* 08/05/2014   HDL 45 08/05/2014   LDLCALC 125* 03/13/2013   ALT  13 08/05/2014   AST 16 08/05/2014   NA 143 08/05/2014   K 4.3 08/05/2014   CL 105 08/05/2014   CREATININE 0.91 08/05/2014   BUN 14 08/05/2014   CO2 22 08/05/2014    TSH 1.140 08/05/2014    Ct Abdomen Pelvis W Contrast  06/15/2014   CLINICAL DATA:  Three-week history of diarrhea and cramping.  EXAM: CT ABDOMEN AND PELVIS WITH CONTRAST  TECHNIQUE: Multidetector CT imaging of the abdomen and pelvis was performed using the standard protocol following bolus administration of intravenous contrast.  CONTRAST:  46m OMNIPAQUE IOHEXOL 300 MG/ML SOLN, 1061mOMNIPAQUE IOHEXOL 300 MG/ML SOLN  COMPARISON:  None.  FINDINGS: There is mural thickening and edema throughout the colon with appearances typical of colitis. The changes do not conform to a vascular territory to suggest ischemic colitis. This most likely as an infectious or inflammatory colitis and involves the entire colon. There is no obstruction. There is no free intraperitoneal air. There is no significant abnormality of the small bowel. There is no significant diverticular disease.  The liver, gallbladder and bile ducts appear normal. The pancreas appears normal. The spleen appears normal. The adrenals and kidneys appear normal.  There is a very small volume ascites collected in the dependent pelvic peritoneum.  Included portions of the lower chest are remarkable only for mild linear posterior base atelectasis or scarring.  Incidental note is made of mild degenerative appearing retrolisthesis of L4 on L5. No significant skeletal lesions are evident.  IMPRESSION: 1. Abnormal mural thickening and edema throughout the colon consistent with colitis 2. Negative for obstruction, perforation or evidence of ischemia   Electronically Signed   By: DaAndreas Newport.D.   On: 06/15/2014 19:31    Assessment & Plan:   RaBreakeras seen today for annual exam.  Diagnoses and all orders for this visit:  Wellness examination Orders: -     Cancel: POCT CBC -     CMP14+EGFR -     NMR, lipoprofile -     Thyroid Panel With TSH -     Vit D  25 hydroxy (rtn osteoporosis monitoring) -     CBC with Differential/Platelet  Tobacco  use disorder, continuous Orders: -     varenicline (CHANTIX STARTING MONTH PAK) 0.5 MG X 11 & 1 MG X 42 tablet; Take one 0.5 mg tablet by mouth once daily for 3 days, then increase to one 0.5 mg tablet twice daily for 4 days, then increase to one 1 mg tablet twice daily.  Tobacco abuse counseling Orders: -     varenicline (CHANTIX STARTING MONTH PAK) 0.5 MG X 11 & 1 MG X 42 tablet; Take one 0.5 mg tablet by mouth once daily for 3 days, then increase to one 0.5 mg tablet twice daily for 4 days, then increase to one 1 mg tablet twice daily.   I have discontinued Mr. GaStegmanetroNIDAZOLE, pantoprazole, ondansetron, ciprofloxacin, HYDROcodone-acetaminophen, and diphenoxylate-atropine. I am also having him start on varenicline.  Meds ordered this encounter  Medications  . varenicline (CHANTIX STARTING MONTH PAK) 0.5 MG X 11 & 1 MG X 42 tablet    Sig: Take one 0.5 mg tablet by mouth once daily for 3 days, then increase to one 0.5 mg tablet twice daily for 4 days, then increase to one 1 mg tablet twice daily.    Dispense:  53 tablet    Refill:  0   Patient was counseled on appropriate diet. Low carbohydrate, low sodium, high  protein approach.   Regular exercise benefit with mix of cardio and resistance training was reviewed.  Reminded to wear seat belt when driving or a passenger.   Follow-up: Return in about 6 months (around 02/04/2015).  Claretta Fraise, M.D.

## 2014-08-06 ENCOUNTER — Other Ambulatory Visit: Payer: Self-pay | Admitting: Family Medicine

## 2014-08-06 ENCOUNTER — Encounter: Payer: Self-pay | Admitting: Family Medicine

## 2014-08-06 LAB — CBC WITH DIFFERENTIAL/PLATELET
BASOS: 0 %
Basophils Absolute: 0 10*3/uL (ref 0.0–0.2)
EOS ABS: 0.1 10*3/uL (ref 0.0–0.4)
EOS: 2 %
HEMATOCRIT: 43.4 % (ref 37.5–51.0)
HEMOGLOBIN: 14.4 g/dL (ref 12.6–17.7)
IMMATURE GRANS (ABS): 0 10*3/uL (ref 0.0–0.1)
Immature Granulocytes: 0 %
LYMPHS: 22 %
Lymphocytes Absolute: 1.6 10*3/uL (ref 0.7–3.1)
MCH: 27.8 pg (ref 26.6–33.0)
MCHC: 33.2 g/dL (ref 31.5–35.7)
MCV: 84 fL (ref 79–97)
Monocytes Absolute: 0.6 10*3/uL (ref 0.1–0.9)
Monocytes: 8 %
NEUTROS ABS: 4.9 10*3/uL (ref 1.4–7.0)
NEUTROS PCT: 68 %
Platelets: 166 10*3/uL (ref 150–379)
RBC: 5.18 x10E6/uL (ref 4.14–5.80)
RDW: 14.6 % (ref 12.3–15.4)
WBC: 7.3 10*3/uL (ref 3.4–10.8)

## 2014-08-06 LAB — CMP14+EGFR
A/G RATIO: 1.6 (ref 1.1–2.5)
ALK PHOS: 61 IU/L (ref 39–117)
ALT: 13 IU/L (ref 0–44)
AST: 16 IU/L (ref 0–40)
Albumin: 4.2 g/dL (ref 3.5–5.5)
BILIRUBIN TOTAL: 0.5 mg/dL (ref 0.0–1.2)
BUN/Creatinine Ratio: 15 (ref 8–19)
BUN: 14 mg/dL (ref 6–20)
CALCIUM: 9.1 mg/dL (ref 8.7–10.2)
CO2: 22 mmol/L (ref 18–29)
Chloride: 105 mmol/L (ref 97–108)
Creatinine, Ser: 0.91 mg/dL (ref 0.76–1.27)
GFR calc Af Amer: 126 mL/min/{1.73_m2} (ref >59)
GFR, EST NON AFRICAN AMERICAN: 109 mL/min/{1.73_m2} (ref >59)
Globulin, Total: 2.6 g/dL (ref 1.5–4.5)
Glucose: 99 mg/dL (ref 65–99)
POTASSIUM: 4.3 mmol/L (ref 3.5–5.2)
SODIUM: 143 mmol/L (ref 134–144)
Total Protein: 6.8 g/dL (ref 6.0–8.5)

## 2014-08-06 LAB — NMR, LIPOPROFILE
CHOLESTEROL: 196 mg/dL (ref 100–199)
HDL Cholesterol by NMR: 45 mg/dL (ref >39)
HDL Particle Number: 30.7 umol/L (ref >=30.5)
LDL PARTICLE NUMBER: 1630 nmol/L — AB (ref <1000)
LDL SIZE: 21.3 nm (ref >20.5)
LDL-C: 120 mg/dL — ABNORMAL HIGH (ref 0–99)
LP-IR SCORE: 50 — AB (ref <=45)
Small LDL Particle Number: 753 nmol/L — ABNORMAL HIGH (ref <=527)
Triglycerides by NMR: 154 mg/dL — ABNORMAL HIGH (ref 0–149)

## 2014-08-06 LAB — VITAMIN D 25 HYDROXY (VIT D DEFICIENCY, FRACTURES): Vit D, 25-Hydroxy: 6.8 ng/mL — ABNORMAL LOW (ref 30.0–100.0)

## 2014-08-06 LAB — THYROID PANEL WITH TSH
Free Thyroxine Index: 2.9 (ref 1.2–4.9)
T3 Uptake Ratio: 28 % (ref 24–39)
T4, Total: 10.3 ug/dL (ref 4.5–12.0)
TSH: 1.14 u[IU]/mL (ref 0.450–4.500)

## 2014-08-06 MED ORDER — ATORVASTATIN CALCIUM 40 MG PO TABS: 40.0000 mg | ORAL_TABLET | Freq: Every day | ORAL | Status: DC

## 2014-11-22 ENCOUNTER — Encounter: Payer: Self-pay | Admitting: *Deleted

## 2015-01-28 DIAGNOSIS — E785 Hyperlipidemia, unspecified: Secondary | ICD-10-CM | POA: Insufficient documentation

## 2015-01-28 NOTE — Progress Notes (Deleted)
Subjective:  Patient ID: Derek Perez, male    DOB: [birth year]  Age: 34 y.o. MRN: [id number]  CC: No chief complaint on file.   HPI Derek Perez presents for follow-up of elevated cholesterol. Doing well without complaints on current medication. Denies side effects of statin including myalgia and arthralgia and nausea. Also in today for liver function testing. Currently no chest pain, shortness of breath or other cardiovascular related symptoms noted. History Derek Perez has a past medical history of Arthritis.   He has past surgical history that includes Ankle surgery.   His family history is negative for Cancer, Heart disease, and Stroke.He reports that he has been smoking.  He has never used smokeless tobacco. He reports that he does not drink alcohol or use illicit drugs.  Current Outpatient Prescriptions on File Prior to Visit  Medication Sig Dispense Refill  . atorvastatin (LIPITOR) 40 MG tablet Take 1 tablet (40 mg total) by mouth daily. For cholesterol 90 tablet 3  . varenicline (CHANTIX STARTING MONTH PAK) 0.5 MG X 11 & 1 MG X 42 tablet Take one 0.5 mg tablet by mouth once daily for 3 days, then increase to one 0.5 mg tablet twice daily for 4 days, then increase to one 1 mg tablet twice daily. 53 tablet 0   No current facility-administered medications on file prior to visit.    ROS Review of Systems  Constitutional: Negative for fever, chills and diaphoresis.  HENT: Negative for congestion, rhinorrhea and sore throat.   Respiratory: Negative for cough, shortness of breath and wheezing.   Cardiovascular: Negative for chest pain.  Gastrointestinal: Negative for nausea, vomiting, abdominal pain, diarrhea, constipation and abdominal distention.  Genitourinary: Negative for dysuria and frequency.  Musculoskeletal: Negative for joint swelling and arthralgias.  Skin: Negative for rash.  Neurological: Negative for headaches.    Objective:  There were no vitals taken for  this visit.  BP Readings from Last 3 Encounters:  08/05/14 142/88  06/15/14 105/85  06/13/14 139/94    Wt Readings from Last 3 Encounters:  08/05/14 322 lb 9.6 oz (146.33 kg)  06/13/14 316 lb (143.337 kg)  05/30/14 320 lb (145.151 kg)     Physical Exam  Constitutional: He is oriented to person, place, and time. He appears well-developed and well-nourished. No distress.  HENT:  Head: Normocephalic and atraumatic.  Right Ear: External ear normal.  Left Ear: External ear normal.  Nose: Nose normal.  Mouth/Throat: Oropharynx is clear and moist.  Eyes: Conjunctivae and EOM are normal. Pupils are equal, round, and reactive to light.  Neck: Normal range of motion. Neck supple. No thyromegaly present.  Cardiovascular: Normal rate, regular rhythm and normal heart sounds.   No murmur heard. Pulmonary/Chest: Effort normal and breath sounds normal. No respiratory distress. He has no wheezes. He has no rales.  Abdominal: Soft. Bowel sounds are normal. He exhibits no distension. There is no tenderness.  Lymphadenopathy:    He has no cervical adenopathy.  Neurological: He is alert and oriented to person, place, and time. He has normal reflexes.  Skin: Skin is warm and dry.  Psychiatric: He has a normal mood and affect. His behavior is normal. Judgment and thought content normal.    No results found for: HGBA1C  Lab Results  Component Value Date   WBC 7.3 08/05/2014   HGB 14.4 08/05/2014   HCT 43.4 08/05/2014   PLT 166 08/05/2014   GLUCOSE 99 08/05/2014   CHOL 196 08/05/2014   TRIG 154* 08/05/2014  HDL 45 08/05/2014   LDLCALC 125* 03/13/2013   ALT 13 08/05/2014   AST 16 08/05/2014   NA 143 08/05/2014   K 4.3 08/05/2014   CL 105 08/05/2014   CREATININE 0.91 08/05/2014   BUN 14 08/05/2014   CO2 22 08/05/2014   TSH 1.140 08/05/2014    Ct Abdomen Pelvis W Contrast  06/15/2014   CLINICAL DATA:  Three-week history of diarrhea and cramping.  EXAM: CT ABDOMEN AND PELVIS WITH  CONTRAST  TECHNIQUE: Multidetector CT imaging of the abdomen and pelvis was performed using the standard protocol following bolus administration of intravenous contrast.  CONTRAST:  50mL OMNIPAQUE IOHEXOL 300 MG/ML SOLN, OMNIPAQUE IOHEXOL 300 MG/ML SOLN  COMPARISON:  None.  FINDINGS: There is mural thickening and edema throughout the colon with appearances typical of colitis. The changes do not conform to a vascular territory to suggest ischemic colitis. This most likely as an infectious or inflammatory colitis and involves the entire colon. There is no obstruction. There is no free intraperitoneal air. There is no significant abnormality of the small bowel. There is no significant diverticular disease.  The liver, gallbladder and bile ducts appear normal. The pancreas appears normal. The spleen appears normal. The adrenals and kidneys appear normal.  There is a very small volume ascites collected in the dependent pelvic peritoneum.  Included portions of the lower chest are remarkable only for mild linear posterior base atelectasis or scarring.  Incidental note is made of mild degenerative appearing retrolisthesis of L4 on L5. No significant skeletal lesions are evident.  IMPRESSION: 1. Abnormal mural thickening and edema throughout the colon consistent with colitis 2. Negative for obstruction, perforation or evidence of ischemia   Electronically Signed   By: Ellery Plunk M.D.   On: 06/15/2014 19:31    Assessment & Plan:   Diagnoses and all orders for this visit:  Hyperlipidemia  I am having Derek Perez maintain his varenicline and atorvastatin.  No orders of the defined types were placed in this encounter.     Follow-up: No Follow-up on file.  Mechele Claude, M.D.

## 2015-01-29 ENCOUNTER — Encounter: Payer: BLUE CROSS/BLUE SHIELD | Admitting: Family Medicine

## 2015-01-31 NOTE — Progress Notes (Signed)
Erroneous encounter

## 2015-02-04 ENCOUNTER — Ambulatory Visit: Payer: BLUE CROSS/BLUE SHIELD | Admitting: Family Medicine

## 2015-12-07 IMAGING — CT CT ABD-PELV W/ CM
1 of 2 series · 15 of 32 positions shown, 19 images · IV contrast (OMNIPAQUE 300)
Comparison: None.

CLINICAL DATA: Three-week history of diarrhea and cramping.

EXAM:
CT ABDOMEN AND PELVIS WITH CONTRAST
TECHNIQUE: Multidetector CT imaging of the abdomen and pelvis was performed
using the standard protocol following bolus administration of
intravenous contrast.
CONTRAST:  50mL OMNIPAQUE IOHEXOL 300 MG/ML SOLN, 100mL OMNIPAQUE
IOHEXOL 300 MG/ML SOLN

[Series 2: abd/pel with · axial · 0.85mm/px · z∈[-519,-54]mm · 15 of 103 slices shown, 19 images]
[im 5/103  soft-tissue]
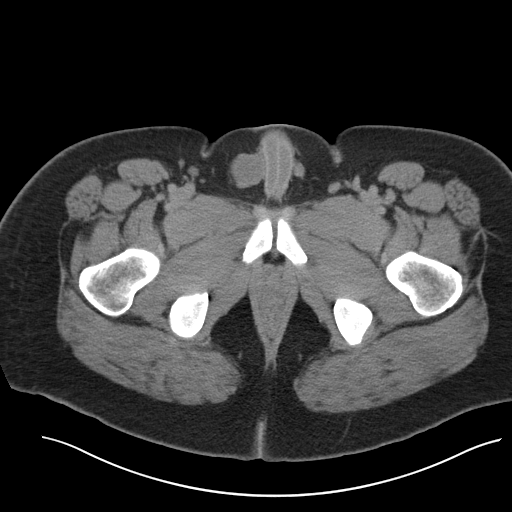
[im 5/103  bone]
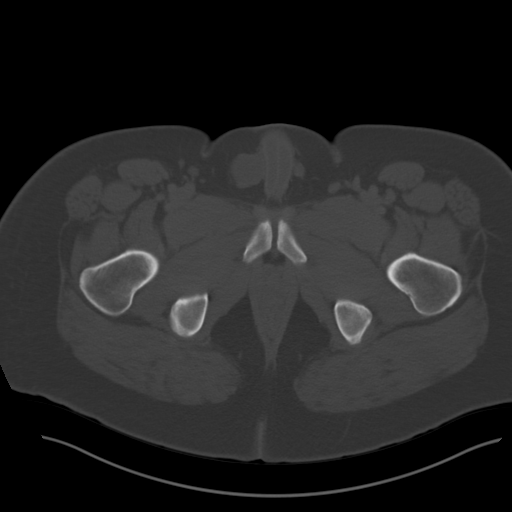
[im 13/103  soft-tissue]
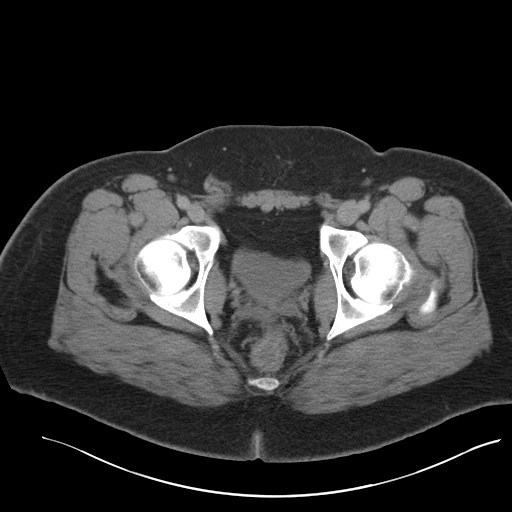
[im 21/103  soft-tissue]
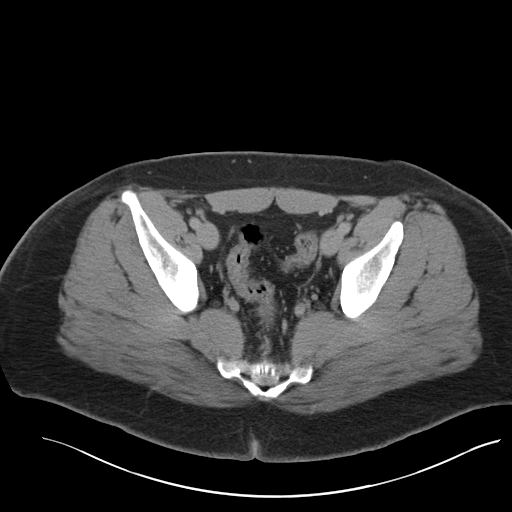
[im 29/103  soft-tissue]
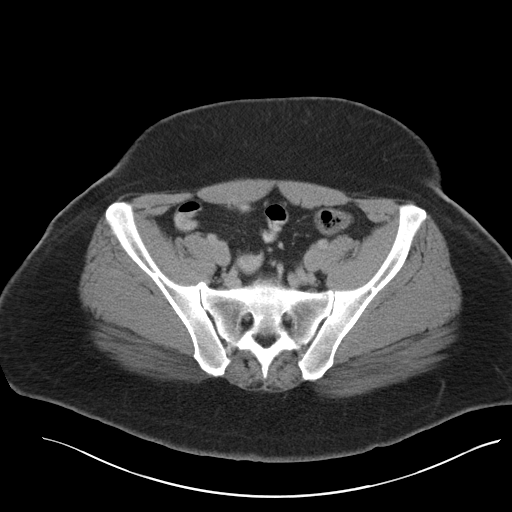
[im 37/103  soft-tissue]
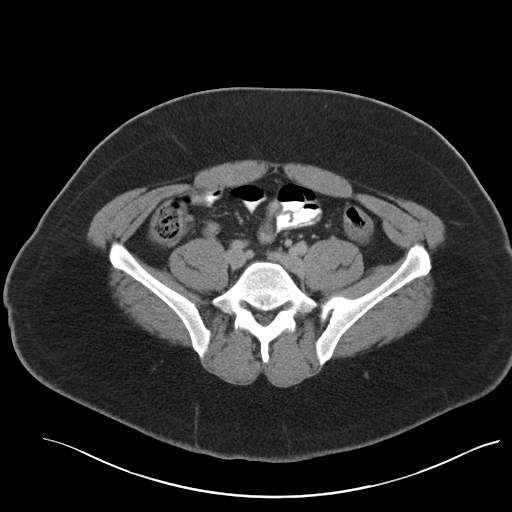
[im 45/103  soft-tissue]
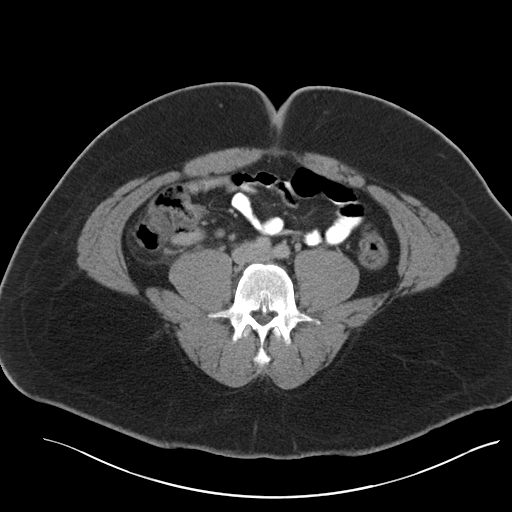
[im 54/103  soft-tissue]
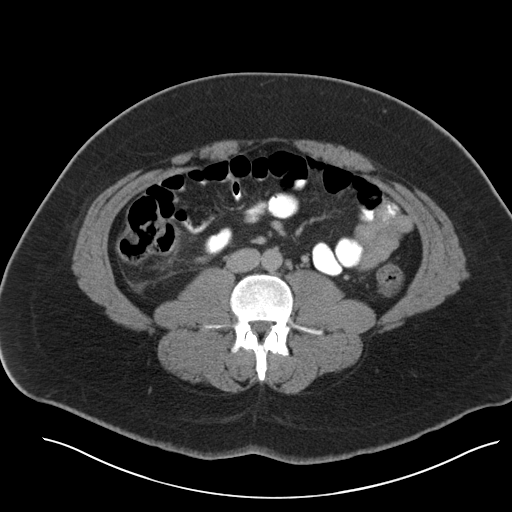
[im 58/103  soft-tissue]
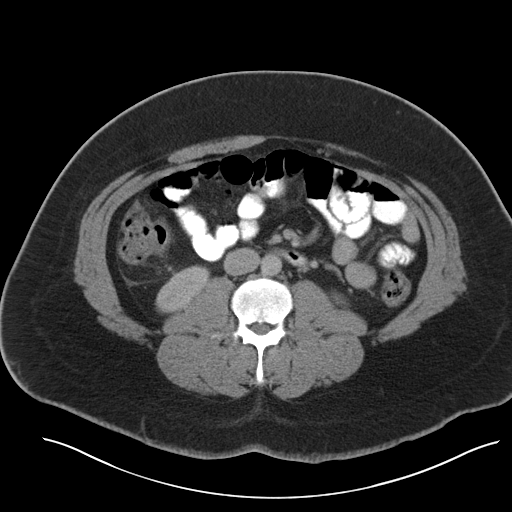
[im 66/103  soft-tissue]
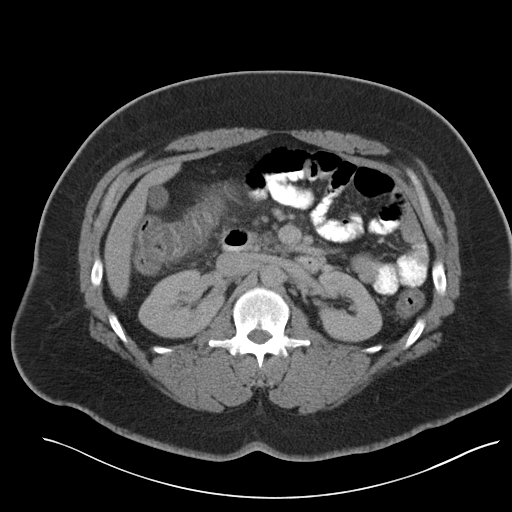
[im 66/103  bone]
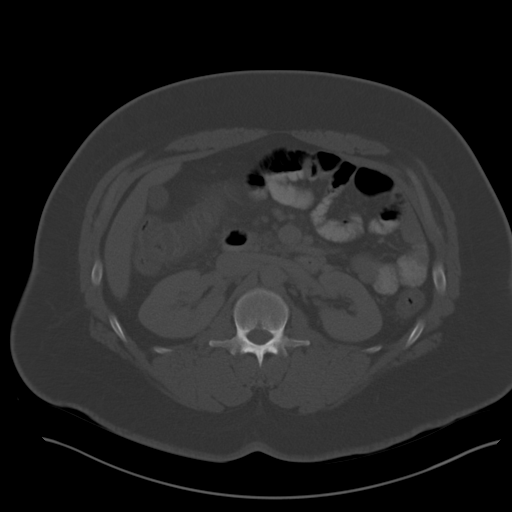
[im 74/103  soft-tissue]
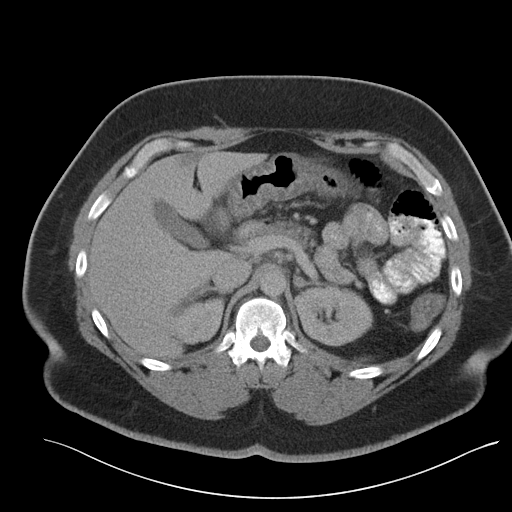
[im 82/103  soft-tissue]
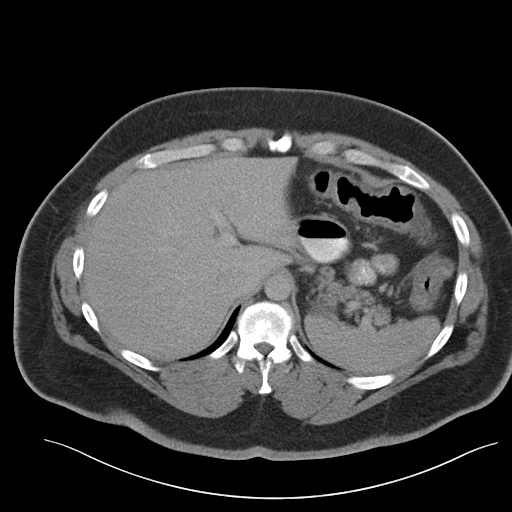
[im 86/103  lung]
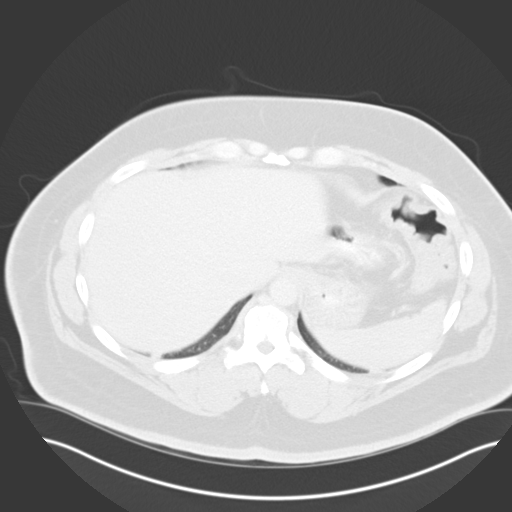
[im 90/103  soft-tissue]
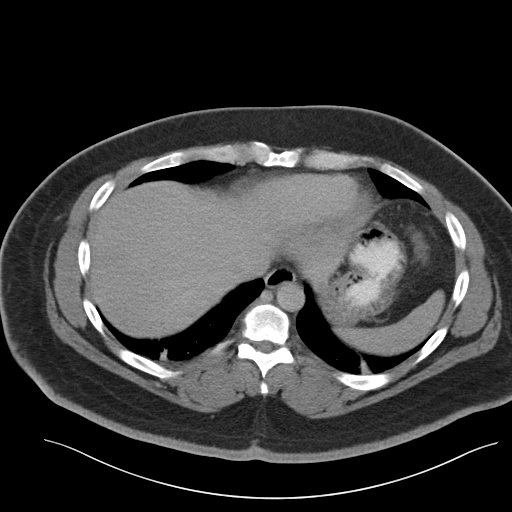
[im 90/103  lung]
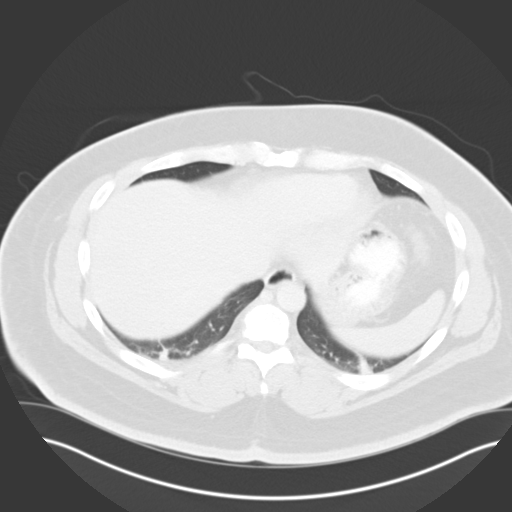
[im 94/103  lung]
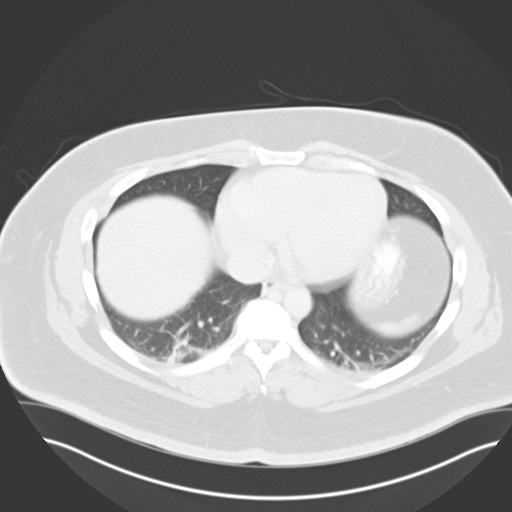
[im 98/103  soft-tissue]
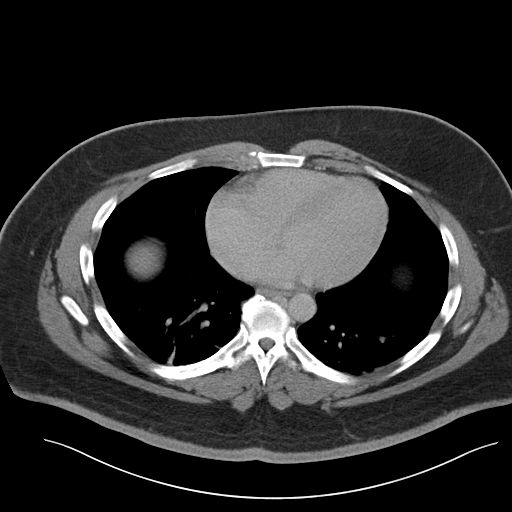
[im 98/103  lung]
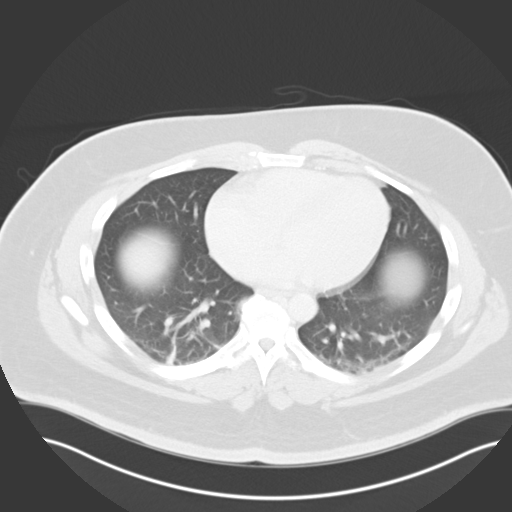

[15 of 32 positions shown; findings below may reference images not displayed]

FINDINGS: There is mural thickening and edema throughout the colon with
appearances typical of colitis. The changes do not conform to a
vascular territory to suggest ischemic colitis. This most likely as
an infectious or inflammatory colitis and involves the entire colon.
There is no obstruction. There is no free intraperitoneal air. There
is no significant abnormality of the small bowel. There is no
significant diverticular disease.

The liver, gallbladder and bile ducts appear normal. The pancreas
appears normal. The spleen appears normal. The adrenals and kidneys
appear normal.

There is a very small volume ascites collected in the dependent
pelvic peritoneum.

Included portions of the lower chest are remarkable only for mild
linear posterior base atelectasis or scarring.

Incidental note is made of mild degenerative appearing
retrolisthesis of L4 on L5. No significant skeletal lesions are
evident.
IMPRESSION: 1. Abnormal mural thickening and edema throughout the colon
consistent with colitis
2. Negative for obstruction, perforation or evidence of ischemia

## 2016-09-16 DIAGNOSIS — H04123 Dry eye syndrome of bilateral lacrimal glands: Secondary | ICD-10-CM | POA: Diagnosis not present

## 2016-09-16 DIAGNOSIS — H40033 Anatomical narrow angle, bilateral: Secondary | ICD-10-CM | POA: Diagnosis not present

## 2016-09-16 DIAGNOSIS — H5213 Myopia, bilateral: Secondary | ICD-10-CM | POA: Diagnosis not present

## 2016-09-23 DIAGNOSIS — H04123 Dry eye syndrome of bilateral lacrimal glands: Secondary | ICD-10-CM | POA: Diagnosis not present

## 2017-03-22 ENCOUNTER — Telehealth: Payer: Self-pay | Admitting: Family Medicine

## 2017-03-22 MED ORDER — PERMETHRIN 5 % EX CREA: 1.0000 "application " | TOPICAL_CREAM | CUTANEOUS | 0 refills | Status: DC | PRN

## 2017-03-22 NOTE — Telephone Encounter (Signed)
Derek Perez  was seen by  Ms Yetta BarreJones yesterday and did not get an Rx for lice sent to [facility].  Please advise.

## 2017-03-22 NOTE — Telephone Encounter (Signed)
Prescription sent to pharmacy.

## 2017-08-01 DIAGNOSIS — H5210 Myopia, unspecified eye: Secondary | ICD-10-CM | POA: Diagnosis not present

## 2017-10-31 ENCOUNTER — Other Ambulatory Visit: Payer: Self-pay

## 2017-10-31 MED ORDER — LOSARTAN POTASSIUM 50 MG PO TABS: 50.0000 mg | ORAL_TABLET | Freq: Every day | ORAL | 0 refills | Status: DC

## 2017-11-09 ENCOUNTER — Ambulatory Visit: Payer: BLUE CROSS/BLUE SHIELD | Admitting: Family Medicine

## 2017-11-09 ENCOUNTER — Encounter: Payer: Self-pay | Admitting: Family Medicine

## 2017-11-09 VITALS — BP 118/80 | HR 90 | Temp 97.3°F | Ht 76.0 in | Wt 313.0 lb

## 2017-11-09 DIAGNOSIS — I10 Essential (primary) hypertension: Secondary | ICD-10-CM

## 2017-11-09 DIAGNOSIS — L089 Local infection of the skin and subcutaneous tissue, unspecified: Secondary | ICD-10-CM | POA: Diagnosis not present

## 2017-11-09 DIAGNOSIS — Z23 Encounter for immunization: Secondary | ICD-10-CM

## 2017-11-09 DIAGNOSIS — F17200 Nicotine dependence, unspecified, uncomplicated: Secondary | ICD-10-CM | POA: Diagnosis not present

## 2017-11-09 DIAGNOSIS — B351 Tinea unguium: Secondary | ICD-10-CM

## 2017-11-09 LAB — CMP14+EGFR
A/G RATIO: 1.5 (ref 1.2–2.2)
ALBUMIN: 4.2 g/dL (ref 3.5–5.5)
ALT: 22 IU/L (ref 0–44)
AST: 26 IU/L (ref 0–40)
Alkaline Phosphatase: 61 IU/L (ref 39–117)
BUN / CREAT RATIO: 15 (ref 9–20)
BUN: 17 mg/dL (ref 6–20)
Bilirubin Total: 0.7 mg/dL (ref 0.0–1.2)
CALCIUM: 9.8 mg/dL (ref 8.7–10.2)
CO2: 24 mmol/L (ref 20–29)
Chloride: 104 mmol/L (ref 96–106)
Creatinine, Ser: 1.13 mg/dL (ref 0.76–1.27)
GFR, EST AFRICAN AMERICAN: 95 mL/min/{1.73_m2} (ref >59)
GFR, EST NON AFRICAN AMERICAN: 82 mL/min/{1.73_m2} (ref >59)
Globulin, Total: 2.8 g/dL (ref 1.5–4.5)
Glucose: 84 mg/dL (ref 65–99)
Potassium: 4.9 mmol/L (ref 3.5–5.2)
Sodium: 142 mmol/L (ref 134–144)
TOTAL PROTEIN: 7 g/dL (ref 6.0–8.5)

## 2017-11-09 LAB — LIPID PANEL
CHOL/HDL RATIO: 3.5 ratio (ref 0.0–5.0)
Cholesterol, Total: 188 mg/dL (ref 100–199)
HDL: 53 mg/dL (ref >39)
LDL Calculated: 123 mg/dL — ABNORMAL HIGH (ref 0–99)
Triglycerides: 62 mg/dL (ref 0–149)
VLDL CHOLESTEROL CAL: 12 mg/dL (ref 5–40)

## 2017-11-09 MED ORDER — LOSARTAN POTASSIUM 50 MG PO TABS: 50.0000 mg | ORAL_TABLET | Freq: Every day | ORAL | 1 refills | Status: DC

## 2017-11-09 MED ORDER — BUPROPION HCL ER (XL) 150 MG PO TB24: 150.0000 mg | ORAL_TABLET | Freq: Every day | ORAL | 1 refills | Status: DC

## 2017-11-09 MED ORDER — TERBINAFINE HCL 250 MG PO TABS: 250.0000 mg | ORAL_TABLET | Freq: Every day | ORAL | 2 refills | Status: DC

## 2017-11-09 NOTE — Progress Notes (Signed)
BP 118/80   Pulse 90   Temp (!) 97.3 F (36.3 C) (Oral)   Ht _0  (1.93 m)   Wt (!) 313 lb (142 kg)   BMI 38.10 kg/m    Subjective:    Patient ID: Derek Perez, male    DOB: 1980-10-10, 37 y.o.   MRN: [id number]  HPI: Derek Perez is a 37 y.o. male presenting on 11/09/2017 for Establish Care   HPI Hypertension Patient is coming in as a new patient but the medication that was started on him was started by a nurse at his work which is [employer].  Patient is currently on losartan, and their blood pressure today is 118/80. Patient denies any lightheadedness or dizziness. Patient denies headaches, blurred vision, chest pains, shortness of breath, or weakness. Denies any side effects from medication and is content with current medication.   Thickened and yellow toenails Patient has been having thickened and yellow toenails that is been worsening more since the past few years.  He has had this quite a bit of his life but over the past few years they becoming even more thick.  He has 2 toenails on his left foot and one on his right foot that are thickened like this.  He does admit to having a history of athlete's foot a lot in his life.  Acne and scarring on his back Patient has what he thinks is acne and scarring on his back with multiple pustules and his wife says she frequently has to pop them and there is a thick yellowish material that comes out of them.  He has scarring from this.  He has been dealing with this for many years and has not sought medical attention for it to this point.  He is not using anything on it except having his wife popped or lanced the lesions when they become very inflamed.  He denies any of these lesions anywhere else on his body.  He denies any fevers or chills.  He says he will become red and slightly inflamed and then will drain.  Relevant past medical, surgical, family and social history reviewed and updated as indicated. Interim medical history  since our last visit reviewed. Allergies and medications reviewed and updated.  Review of Systems  Constitutional: Negative for chills and fever.  HENT: Negative for ear pain and tinnitus.   Eyes: Negative for pain and discharge.  Respiratory: Negative for cough, shortness of breath and wheezing.   Cardiovascular: Negative for chest pain, palpitations and leg swelling.  Gastrointestinal: Negative for abdominal pain, blood in stool, constipation and diarrhea.  Genitourinary: Negative for dysuria and hematuria.  Musculoskeletal: Negative for back pain, gait problem and myalgias.  Skin: Positive for color change (Toenail color change) and rash.  Neurological: Negative for dizziness, weakness and headaches.  Psychiatric/Behavioral: Negative for suicidal ideas.  All other systems reviewed and are negative.   Per HPI unless specifically indicated above  Social History   Socioeconomic History  . Marital status: Married    Spouse name: Not on file  . Number of children: Not on file  . Years of education: Not on file  . Highest education level: Not on file  Occupational History  . Not on file  Social Needs  . Financial resource strain: Not on file  . Food insecurity:    Worry: Not on file    Inability: Not on file  . Transportation needs:    Medical: Not on file    Non-medical:  Not on file  Tobacco Use  . Smoking status: Current Every Day Smoker    Packs/day: 0.50    Types: Cigarettes  . Smokeless tobacco: Never Used  Substance and Sexual Activity  . Alcohol use: Never    Frequency: Never  . Drug use: No  . Sexual activity: Yes    Comment: married for 17 years  Lifestyle  . Physical activity:    Days per week: Not on file    Minutes per session: Not on file  . Stress: Not on file  Relationships  . Social connections:    Talks on phone: Not on file    Gets together: Not on file    Attends religious service: Not on file    Active member of club or organization: Not on  file    Attends meetings of clubs or organizations: Not on file    Relationship status: Not on file  . Intimate partner violence:    Fear of current or ex partner: Not on file    Emotionally abused: Not on file    Physically abused: Not on file    Forced sexual activity: Not on file  Other Topics Concern  . Not on file  Social History Narrative  . Not on file    Past Surgical History:  Procedure Laterality Date  . ANKLE SURGERY     right  . FRACTURE SURGERY  1998   right ankle    Family History  Problem Relation Age of Onset  . Diabetes Mother   . Hyperlipidemia Mother   . COPD Maternal Grandmother   . Diabetes Maternal Grandmother   . Heart disease Maternal Grandmother   . Cancer Neg Hx   . Stroke Neg Hx     Allergies as of 11/09/2017   No Known Allergies     Medication List        Accurate as of 11/09/17  1:09 PM. Always use your most recent med list.          buPROPion 150 MG 24 hr tablet Commonly known as:  WELLBUTRIN XL Take 1 tablet (150 mg total) by mouth daily.   losartan 50 MG tablet Commonly known as:  COZAAR Take 1 tablet (50 mg total) by mouth daily.   terbinafine 250 MG tablet Commonly known as:  LAMISIL Take 1 tablet (250 mg total) by mouth daily.          Objective:    BP 118/80   Pulse 90   Temp (!) 97.3 F (36.3 C) (Oral)   Ht _0  (1.93 m)   Wt (!) 313 lb (142 kg)   BMI 38.10 kg/m   Wt Readings from Last 3 Encounters:  11/09/17 (!) 313 lb (142 kg)  08/05/14 (!) 322 lb 9.6 oz (146.3 kg)  06/13/14 (!) 316 lb (143.3 kg)    Physical Exam  Constitutional: He is oriented to person, place, and time. He appears well-developed and well-nourished. No distress.  Eyes: Pupils are equal, round, and reactive to light. Conjunctivae and EOM are normal. Right eye exhibits no discharge. No scleral icterus.  Neck: Neck supple. No thyromegaly present.  Cardiovascular: Normal rate, regular rhythm, normal heart sounds and intact distal  pulses.  No murmur heard. Pulmonary/Chest: Effort normal and breath sounds normal. No respiratory distress. He has no wheezes.  Musculoskeletal: Normal range of motion. He exhibits no edema.  Lymphadenopathy:    He has no cervical adenopathy.  Neurological: He is alert and oriented to person,  place, and time. Coordination normal.  Skin: Skin is warm and dry. Rash (Patient has what appears to be nodular pustular acne on his back with scarring, will will refer to dermatology) noted. Rash is nodular and pustular. He is not diaphoretic.  Thickened and yellow toenails on his first and second nails on the left foot and his second nail on the right foot  Psychiatric: He has a normal mood and affect. His behavior is normal.  Nursing note and vitals reviewed.       Assessment & Plan:   Problem List Items Addressed This Visit      Cardiovascular and Mediastinum   Hypertension - Primary   Relevant Medications   losartan (COZAAR) 50 MG tablet   Other Relevant Orders   CMP14+EGFR   Lipid panel     Other   Needs smoking cessation education   Relevant Medications   buPROPion (WELLBUTRIN XL) 150 MG 24 hr tablet   Morbid obesity ([place])    Other Visit Diagnoses    Pustular inflammation of skin       Relevant Medications   terbinafine (LAMISIL) 250 MG tablet   Other Relevant Orders   Ambulatory referral to Dermatology   Onychomycosis       Relevant Medications   terbinafine (LAMISIL) 250 MG tablet     Patient would like to quit smoking and will try Wellbutrin for this.  Follow up plan: Return in about 6 months (around 05/12/2018), or if symptoms worsen or fail to improve, for htn.  Caryl Pina, MD [facility] 11/09/2017, 1:09 PM

## 2018-03-06 ENCOUNTER — Telehealth: Payer: Self-pay | Admitting: Family Medicine

## 2018-03-06 MED ORDER — IVERMECTIN 0.5 % EX LOTN: 1.0000 "application " | TOPICAL_LOTION | Freq: Once | CUTANEOUS | 0 refills | Status: AC

## 2018-03-06 NOTE — Telephone Encounter (Signed)
wife has called asking for something to be sent to [facility] for head lice please dont send in permethrin (ELIMITE) 5 % cream   it didn't help last time for his daughter

## 2018-05-11 ENCOUNTER — Ambulatory Visit: Payer: BLUE CROSS/BLUE SHIELD | Admitting: Family Medicine

## 2018-08-05 ENCOUNTER — Other Ambulatory Visit: Payer: Self-pay | Admitting: Family Medicine

## 2018-08-07 NOTE — Telephone Encounter (Signed)
Last office visit 11/09/2017

## 2018-08-09 ENCOUNTER — Encounter: Payer: Self-pay | Admitting: Family Medicine

## 2018-08-09 ENCOUNTER — Ambulatory Visit (INDEPENDENT_AMBULATORY_CARE_PROVIDER_SITE_OTHER): Payer: BLUE CROSS/BLUE SHIELD | Admitting: Family Medicine

## 2018-08-09 ENCOUNTER — Other Ambulatory Visit: Payer: Self-pay

## 2018-08-09 DIAGNOSIS — I10 Essential (primary) hypertension: Secondary | ICD-10-CM | POA: Diagnosis not present

## 2018-08-09 DIAGNOSIS — F329 Major depressive disorder, single episode, unspecified: Secondary | ICD-10-CM | POA: Insufficient documentation

## 2018-08-09 DIAGNOSIS — B351 Tinea unguium: Secondary | ICD-10-CM | POA: Diagnosis not present

## 2018-08-09 DIAGNOSIS — F17200 Nicotine dependence, unspecified, uncomplicated: Secondary | ICD-10-CM

## 2018-08-09 DIAGNOSIS — F32A Depression, unspecified: Secondary | ICD-10-CM | POA: Insufficient documentation

## 2018-08-09 DIAGNOSIS — F419 Anxiety disorder, unspecified: Secondary | ICD-10-CM

## 2018-08-09 MED ORDER — BUPROPION HCL ER (XL) 300 MG PO TB24: 300.0000 mg | ORAL_TABLET | Freq: Every day | ORAL | 1 refills | Status: DC

## 2018-08-09 MED ORDER — TERBINAFINE HCL 250 MG PO TABS: 250.0000 mg | ORAL_TABLET | Freq: Every day | ORAL | 2 refills | Status: DC

## 2018-08-09 MED ORDER — LOSARTAN POTASSIUM 50 MG PO TABS: 50.0000 mg | ORAL_TABLET | Freq: Every day | ORAL | 1 refills | Status: DC

## 2018-08-09 NOTE — Progress Notes (Signed)
Virtual Visit via telephone Note  I connected with Derek Perez on 08/09/18 at 1106 by telephone and verified that I am speaking with the correct person using two identifiers. Derek Perez is currently located at work and no other people are currently with her during visit. The provider, Elige RadonJoshua A , MD is located in their office at time of visit.  Call ended at 1118  I discussed the limitations, risks, security and privacy concerns of performing an evaluation and management service by telephone and the availability of in person appointments. I also discussed with the patient that there may be a patient responsible charge related to this service. The patient expressed understanding and agreed to proceed.   History and Present Illness: Hypertension Patient is currently on losartan, and their blood pressure today is unknown because patient does not have a. Patient denies any lightheadedness or dizziness. Patient denies headaches, blurred vision, chest pains, shortness of breath, or weakness. Denies any side effects from medication and is content with current medication.   Smoking cessation Wellbutrin was working in the morning but would wear off as the day progresses. He has thought about it some and would like to increase it if possible.  He denies any side effects from it and says otherwise it is doing great for him except that it just wears off.  It has significantly reduced the amount that he smokes in the morning, we discussed going to possibly twice a day versus increase in the once a day and he preferred to increase to once a day because he feels like he would just forget that twice a day.  No diagnosis found.  Outpatient Encounter Medications as of 08/09/2018  Medication Sig  . buPROPion (WELLBUTRIN XL) 150 MG 24 hr tablet Take 1 tablet (150 mg total) by mouth daily.  Marland Kitchen. losartan (COZAAR) 50 MG tablet TAKE 1 TABLET DAILY  . terbinafine (LAMISIL) 250 MG tablet Take 1 tablet  (250 mg total) by mouth daily.   No facility-administered encounter medications on file as of 08/09/2018.     Review of Systems  Constitutional: Negative for chills and fever.  Eyes: Negative for visual disturbance.  Respiratory: Negative for shortness of breath and wheezing.   Cardiovascular: Negative for chest pain and leg swelling.  Musculoskeletal: Negative for back pain and gait problem.  Skin: Negative for rash.  Neurological: Negative for dizziness, weakness, light-headedness and numbness.  All other systems reviewed and are negative.   Observations/Objective: Patient sounds comfortable on the phone and in no acute distress  Assessment and Plan: Problem List Items Addressed This Visit      Cardiovascular and Mediastinum   Hypertension - Primary   Relevant Medications   losartan (COZAAR) 50 MG tablet     Other   Needs smoking cessation education   Relevant Medications   buPROPion (WELLBUTRIN XL) 300 MG 24 hr tablet    Other Visit Diagnoses    Onychomycosis       Relevant Medications   terbinafine (LAMISIL) 250 MG tablet       Follow Up Instructions:  6 month hypertension and smoking cessation recheck   I discussed the assessment and treatment plan with the patient. The patient was provided an opportunity to ask questions and all were answered. The patient agreed with the plan and demonstrated an understanding of the instructions.   The patient was advised to call back or seek an in-person evaluation if the symptoms worsen or if the condition fails to improve as  anticipated.  The above assessment and management plan was discussed with the patient. The patient verbalized understanding of and has agreed to the management plan. Patient is aware to call the clinic if symptoms persist or worsen. Patient is aware when to return to the clinic for a follow-up visit. Patient educated on when it is appropriate to go to the emergency department.    I provided 12 minutes of  non-face-to-face time during this encounter.    Nils Pyle, MD

## 2018-10-24 ENCOUNTER — Other Ambulatory Visit: Payer: Self-pay

## 2018-10-25 ENCOUNTER — Ambulatory Visit: Payer: BLUE CROSS/BLUE SHIELD | Admitting: Family Medicine

## 2018-10-25 ENCOUNTER — Encounter: Payer: Self-pay | Admitting: Family Medicine

## 2018-10-25 VITALS — BP 127/91 | HR 86 | Temp 97.0°F | Ht 76.0 in | Wt 324.2 lb

## 2018-10-25 DIAGNOSIS — M130 Polyarthritis, unspecified: Secondary | ICD-10-CM

## 2018-10-25 MED ORDER — PREDNISONE 20 MG PO TABS: ORAL_TABLET | ORAL | 0 refills | Status: DC

## 2018-10-25 NOTE — Progress Notes (Signed)
BP (!) 127/91   Pulse 86   Temp (!) 97 F (36.1 C) (Oral)   Ht _0  (1.93 m)   Wt (!) 324 lb 3.2 oz (147.1 kg)   BMI 39.46 kg/m    Subjective:   Patient ID: Derek Perez, male    DOB: 08/04/1980, 38 y.o.   MRN: [id number]  HPI: Derek Perez is a 38 y.o. male presenting on 10/25/2018 for Hand Pain (bilateral- Patient states it has been going on for a few months and started in right hand and now is in both)   HPI Bilateral hand pain Patient comes in complaining of bilateral pain in his hand of the third MCP joints on both hands, he says initially started on his right hand but now is gotten into his left hand.  It is in both of the third MCP joints on both sides.  He has tried to use some anti-inflammatories and ibuprofen and Tylenol for it and it does not seem to be improving.  He says it feels like it is been worsening and more swelling especially on his right hand.  He does not know if his new job where he has to lift and not a new things is causing this but just wanted to get it checked out.  Relevant past medical, surgical, family and social history reviewed and updated as indicated. Interim medical history since our last visit reviewed. Allergies and medications reviewed and updated.  Review of Systems  Constitutional: Negative for chills and fever.  Respiratory: Negative for shortness of breath and wheezing.   Cardiovascular: Negative for chest pain and leg swelling.  Musculoskeletal: Positive for arthralgias and joint swelling. Negative for back pain and gait problem.  Skin: Negative for rash.  Neurological: Negative for dizziness, weakness and light-headedness.  All other systems reviewed and are negative.   Per HPI unless specifically indicated above   Allergies as of 10/25/2018   No Known Allergies     Medication List       Accurate as of October 25, 2018  8:47 AM. If you have any questions, ask your nurse or doctor.        buPROPion 300 MG 24 hr tablet  Commonly known as: Wellbutrin XL Take 1 tablet (300 mg total) by mouth daily.   losartan 50 MG tablet Commonly known as: COZAAR Take 1 tablet (50 mg total) by mouth daily.   predniSONE 20 MG tablet Commonly known as: DELTASONE 2 po at same time daily for 5 days Started by: Fransisca Kaufmann Dettinger, MD   terbinafine 250 MG tablet Commonly known as: LAMISIL Take 1 tablet (250 mg total) by mouth daily.        Objective:   BP (!) 127/91   Pulse 86   Temp (!) 97 F (36.1 C) (Oral)   Ht _1  (1.93 m)   Wt (!) 324 lb 3.2 oz (147.1 kg)   BMI 39.46 kg/m   Wt Readings from Last 3 Encounters:  10/25/18 (!) 324 lb 3.2 oz (147.1 kg)  11/09/17 (!) 313 lb (142 kg)  08/05/14 (!) 322 lb 9.6 oz (146.3 kg)    Physical Exam Vitals signs and nursing note reviewed.  Constitutional:      General: He is not in acute distress.    Appearance: He is well-developed. He is not diaphoretic.  Neck:     Thyroid: No thyromegaly.  Musculoskeletal: Normal range of motion.       Hands:  Skin:  General: Skin is warm and dry.     Findings: No erythema or rash.  Neurological:     Mental Status: He is alert and oriented to person, place, and time.     Coordination: Coordination normal.  Psychiatric:        Behavior: Behavior normal.     Assessment & Plan:   Problem List Items Addressed This Visit    None    Visit Diagnoses    Polyarthritis    -  Primary   Both third MCP joints, worse on right   Relevant Medications   predniSONE (DELTASONE) 20 MG tablet   Other Relevant Orders   Arthritis Panel (Completed)   CMP14+EGFR (Completed)      Patient has had 2 months of swelling and inflammation starting in his right third MCP joint and now on his left third MCP joint, some concern for rheumatic arthritis and will do an arthritis panel ruling out that and gout.  Follow up plan: Return if symptoms worsen or fail to improve.  Counseling provided for all of the vaccine components Orders  Placed This Encounter  Procedures  . Arthritis Panel  . Pocahontas Dettinger, MD [facility] 10/25/2018, 8:47 AM

## 2018-10-26 LAB — CMP14+EGFR
ALT: 22 IU/L (ref 0–44)
AST: 22 IU/L (ref 0–40)
Albumin/Globulin Ratio: 1.6 (ref 1.2–2.2)
Albumin: 4.2 g/dL (ref 4.0–5.0)
Alkaline Phosphatase: 65 IU/L (ref 39–117)
BUN/Creatinine Ratio: 14 (ref 9–20)
BUN: 17 mg/dL (ref 6–20)
Bilirubin Total: 0.5 mg/dL (ref 0.0–1.2)
CO2: 25 mmol/L (ref 20–29)
Calcium: 9.5 mg/dL (ref 8.7–10.2)
Chloride: 104 mmol/L (ref 96–106)
Creatinine, Ser: 1.25 mg/dL (ref 0.76–1.27)
GFR calc Af Amer: 83 mL/min/{1.73_m2} (ref >59)
GFR calc non Af Amer: 72 mL/min/{1.73_m2} (ref >59)
Globulin, Total: 2.6 g/dL (ref 1.5–4.5)
Glucose: 92 mg/dL (ref 65–99)
Potassium: 4.4 mmol/L (ref 3.5–5.2)
Sodium: 141 mmol/L (ref 134–144)
Total Protein: 6.8 g/dL (ref 6.0–8.5)

## 2018-10-26 LAB — ARTHRITIS PANEL
Basophils Absolute: 0 10*3/uL (ref 0.0–0.2)
Basos: 1 %
EOS (ABSOLUTE): 0.2 10*3/uL (ref 0.0–0.4)
Eos: 3 %
Hematocrit: 46.5 % (ref 37.5–51.0)
Hemoglobin: 15.4 g/dL (ref 13.0–17.7)
Immature Grans (Abs): 0 10*3/uL (ref 0.0–0.1)
Immature Granulocytes: 0 %
Lymphocytes Absolute: 1.7 10*3/uL (ref 0.7–3.1)
Lymphs: 28 %
MCH: 28.2 pg (ref 26.6–33.0)
MCHC: 33.1 g/dL (ref 31.5–35.7)
MCV: 85 fL (ref 79–97)
Monocytes Absolute: 0.6 10*3/uL (ref 0.1–0.9)
Monocytes: 10 %
Neutrophils Absolute: 3.6 10*3/uL (ref 1.4–7.0)
Neutrophils: 58 %
Platelets: 183 10*3/uL (ref 150–450)
RBC: 5.46 x10E6/uL (ref 4.14–5.80)
RDW: 13.4 % (ref 11.6–15.4)
Rheumatoid fact SerPl-aCnc: <10 IU/mL (ref 0.0–13.9)
Sed Rate: 10 mm/hr (ref 0–15)
Uric Acid: 6 mg/dL (ref 3.7–8.6)
WBC: 6.2 10*3/uL (ref 3.4–10.8)

## 2018-12-25 ENCOUNTER — Other Ambulatory Visit: Payer: Self-pay

## 2018-12-25 ENCOUNTER — Other Ambulatory Visit: Payer: Self-pay | Admitting: Family Medicine

## 2018-12-25 DIAGNOSIS — I10 Essential (primary) hypertension: Secondary | ICD-10-CM

## 2018-12-25 MED ORDER — LOSARTAN POTASSIUM 50 MG PO TABS: 50.0000 mg | ORAL_TABLET | Freq: Every day | ORAL | 0 refills | Status: DC

## 2018-12-25 NOTE — Telephone Encounter (Signed)
Losartan prescription was refilled, patient aware.

## 2018-12-27 ENCOUNTER — Encounter: Payer: Self-pay | Admitting: Family Medicine

## 2018-12-27 ENCOUNTER — Ambulatory Visit (INDEPENDENT_AMBULATORY_CARE_PROVIDER_SITE_OTHER): Payer: BC Managed Care – PPO | Admitting: Family Medicine

## 2018-12-27 DIAGNOSIS — I10 Essential (primary) hypertension: Secondary | ICD-10-CM

## 2018-12-27 DIAGNOSIS — F17200 Nicotine dependence, unspecified, uncomplicated: Secondary | ICD-10-CM | POA: Diagnosis not present

## 2018-12-27 DIAGNOSIS — B351 Tinea unguium: Secondary | ICD-10-CM

## 2018-12-27 MED ORDER — TERBINAFINE HCL 250 MG PO TABS: 250.0000 mg | ORAL_TABLET | Freq: Every day | ORAL | 2 refills | Status: DC

## 2018-12-27 MED ORDER — CHANTIX STARTING MONTH PAK 0.5 MG X 11 & 1 MG X 42 PO TABS: ORAL_TABLET | ORAL | 0 refills | Status: DC

## 2018-12-27 MED ORDER — LOSARTAN POTASSIUM 50 MG PO TABS: 50.0000 mg | ORAL_TABLET | Freq: Every day | ORAL | 3 refills | Status: DC

## 2018-12-27 MED ORDER — VARENICLINE TARTRATE 1 MG PO TABS: 1.0000 mg | ORAL_TABLET | Freq: Two times a day (BID) | ORAL | 1 refills | Status: DC

## 2018-12-27 NOTE — Progress Notes (Signed)
Virtual Visit via telephone Note  I connected with Derek Perez on 12/27/18 at 1054 by telephone and verified that I am speaking with the correct person using two identifiers. Derek SartoriusRaymond Beltre is currently located at home and no other people are currently with her during visit. The provider, Elige RadonJoshua A Amaal Dimartino, MD is located in their office at time of visit.  Call ended at 1110  I discussed the limitations, risks, security and privacy concerns of performing an evaluation and management service by telephone and the availability of in person appointments. I also discussed with the patient that there may be a patient responsible charge related to this service. The patient expressed understanding and agreed to proceed.   History and Present Illness: Patient is still having trouble with smoking and is 1/2 ppd and the wellbutrin and would like to try chantix. He has reduced some but not   Hypertension Patient is currently on losartan, and their blood pressure today is 148/82. Patient denies any lightheadedness or dizziness. Patient denies headaches, blurred vision, chest pains, shortness of breath, or weakness. Denies any side effects from medication and is content with current medication.   Patient has onychomycosis and it is improving on terbinafine, he will continue for one more cycle.  No diagnosis found.  Outpatient Encounter Medications as of 12/27/2018  Medication Sig  . buPROPion (WELLBUTRIN XL) 300 MG 24 hr tablet Take 1 tablet (300 mg total) by mouth daily.  Marland Kitchen. losartan (COZAAR) 50 MG tablet Take 1 tablet (50 mg total) by mouth daily.  . predniSONE (DELTASONE) 20 MG tablet 2 po at same time daily for 5 days  . terbinafine (LAMISIL) 250 MG tablet Take 1 tablet (250 mg total) by mouth daily.   No facility-administered encounter medications on file as of 12/27/2018.     Review of Systems  Constitutional: Negative for chills and fever.  Respiratory: Negative for shortness of  breath and wheezing.   Cardiovascular: Negative for chest pain and leg swelling.  Musculoskeletal: Negative for back pain and gait problem.  Skin: Negative for rash.  Neurological: Negative for dizziness, weakness and light-headedness.  All other systems reviewed and are negative.   Observations/Objective: Patient sounds comfortable and in no acute distress  Assessment and Plan: Problem List Items Addressed This Visit      Cardiovascular and Mediastinum   Hypertension   Relevant Medications   losartan (COZAAR) 50 MG tablet     Other   Needs smoking cessation education - Primary   Relevant Medications   varenicline (CHANTIX CONTINUING MONTH PAK) 1 MG tablet   varenicline (CHANTIX STARTING MONTH PAK) 0.5 MG X 11 & 1 MG X 42 tablet    Other Visit Diagnoses    Onychomycosis       Relevant Medications   terbinafine (LAMISIL) 250 MG tablet       Follow Up Instructions: Continue bp med and try chantix instead of wellbutrin  Follow up in 6 months  Continue medication for onychomycosis   I discussed the assessment and treatment plan with the patient. The patient was provided an opportunity to ask questions and all were answered. The patient agreed with the plan and demonstrated an understanding of the instructions.   The patient was advised to call back or seek an in-person evaluation if the symptoms worsen or if the condition fails to improve as anticipated.  The above assessment and management plan was discussed with the patient. The patient verbalized understanding of and has agreed to the management  plan. Patient is aware to call the clinic if symptoms persist or worsen. Patient is aware when to return to the clinic for a follow-up visit. Patient educated on when it is appropriate to go to the emergency department.    I provided 16 minutes of non-face-to-face time during this encounter.    [name], MD

## 2019-02-13 ENCOUNTER — Other Ambulatory Visit: Payer: Self-pay

## 2019-02-13 DIAGNOSIS — Z20828 Contact with and (suspected) exposure to other viral communicable diseases: Secondary | ICD-10-CM | POA: Diagnosis not present

## 2019-02-13 DIAGNOSIS — Z20822 Contact with and (suspected) exposure to covid-19: Secondary | ICD-10-CM

## 2019-02-15 ENCOUNTER — Telehealth: Payer: Self-pay | Admitting: Family Medicine

## 2019-02-15 LAB — NOVEL CORONAVIRUS, NAA: SARS-CoV-2, NAA: NOT DETECTED

## 2019-02-15 NOTE — Telephone Encounter (Signed)
Yes okay to go ahead and do note to quarantine him for 2 weeks from when she was positive

## 2019-02-15 NOTE — Telephone Encounter (Signed)
Letter sent- patient aware

## 2019-02-15 NOTE — Telephone Encounter (Signed)
Okay for note

## 2019-06-27 ENCOUNTER — Encounter: Payer: Self-pay | Admitting: Family Medicine

## 2019-06-27 ENCOUNTER — Ambulatory Visit (INDEPENDENT_AMBULATORY_CARE_PROVIDER_SITE_OTHER): Payer: BC Managed Care – PPO | Admitting: Family Medicine

## 2019-06-27 DIAGNOSIS — F17200 Nicotine dependence, unspecified, uncomplicated: Secondary | ICD-10-CM

## 2019-06-27 DIAGNOSIS — I10 Essential (primary) hypertension: Secondary | ICD-10-CM | POA: Diagnosis not present

## 2019-06-27 MED ORDER — NICOTINE 14 MG/24HR TD PT24: 14.0000 mg | MEDICATED_PATCH | Freq: Every day | TRANSDERMAL | 1 refills | Status: DC

## 2019-06-27 NOTE — Progress Notes (Signed)
Virtual Visit via telephone Note  I connected with Derek Perez on 06/27/19 at 1108 by telephone and verified that I am speaking with the correct person using two identifiers. Derek Perez is currently located at home and no other people are currently with her during visit. The provider, Fransisca Kaufmann Lyrika Souders, MD is located in their office at time of visit.  Call ended at 1117  I discussed the limitations, risks, security and privacy concerns of performing an evaluation and management service by telephone and the availability of in person appointments. I also discussed with the patient that there may be a patient responsible charge related to this service. The patient expressed understanding and agreed to proceed.   History and Present Illness: Hypertension Patient is currently on losartan, and their blood pressure today is unknown. Patient denies any lightheadedness or dizziness. Patient denies headaches, blurred vision, chest pains, shortness of breath, or weakness. Denies any side effects from medication and is content with current medication.   Smoking cessation Patient never received because of price and insurance coverage. Would like to try patch and smokes 1/2 ppd  1. Essential hypertension   2. Morbid obesity ([place])   3. Needs smoking cessation education     Outpatient Encounter Medications as of 06/27/2019  Medication Sig  . losartan (COZAAR) 50 MG tablet Take 1 tablet (50 mg total) by mouth daily.  . nicotine (NICODERM CQ - DOSED IN MG/24 HOURS) 14 mg/24hr patch Place 1 patch (14 mg total) onto the skin daily.  . [DISCONTINUED] terbinafine (LAMISIL) 250 MG tablet Take 1 tablet (250 mg total) by mouth daily.  . [DISCONTINUED] varenicline (CHANTIX CONTINUING MONTH PAK) 1 MG tablet Take 1 tablet (1 mg total) by mouth 2 (two) times daily.  . [DISCONTINUED] varenicline (CHANTIX STARTING MONTH PAK) 0.5 MG X 11 & 1 MG X 42 tablet Take one 0.5 mg tablet by mouth daily for 3 days,  then one 0.5 mg tablet twice daily for 4 days, then one 1 mg tablet twice daily.   No facility-administered encounter medications on file as of 06/27/2019.    Review of Systems  Constitutional: Negative for chills and fever.  Respiratory: Negative for shortness of breath and wheezing.   Cardiovascular: Negative for chest pain and leg swelling.  Musculoskeletal: Negative for back pain and gait problem.  Skin: Negative for rash.  Neurological: Negative for dizziness, weakness and light-headedness.  All other systems reviewed and are negative.   Observations/Objective: Patient sounds comfortable and in no acute distress  Assessment and Plan: Problem List Items Addressed This Visit      Cardiovascular and Mediastinum   Hypertension - Primary   Relevant Orders   CBC with Differential/Platelet   CMP14+EGFR     Other   Needs smoking cessation education   Relevant Medications   nicotine (NICODERM CQ - DOSED IN MG/24 HOURS) 14 mg/24hr patch   Morbid obesity ([facility])   Relevant Orders   Lipid panel      Continue with current medication and follow-up in 6 months Follow up plan: Return in about 6 months (around 12/25/2019), or if symptoms worsen or fail to improve, for htn.     I discussed the assessment and treatment plan with the patient. The patient was provided an opportunity to ask questions and all were answered. The patient agreed with the plan and demonstrated an understanding of the instructions.   The patient was advised to call back or seek an in-person evaluation if the symptoms worsen or  if the condition fails to improve as anticipated.  The above assessment and management plan was discussed with the patient. The patient verbalized understanding of and has agreed to the management plan. Patient is aware to call the clinic if symptoms persist or worsen. Patient is aware when to return to the clinic for a follow-up visit. Patient educated on when it is appropriate to go to the  emergency department.    I provided 9 minutes of non-face-to-face time during this encounter.    [name], MD

## 2019-09-10 ENCOUNTER — Telehealth: Payer: Self-pay | Admitting: Family Medicine

## 2019-09-10 DIAGNOSIS — I10 Essential (primary) hypertension: Secondary | ICD-10-CM

## 2019-09-10 MED ORDER — LOSARTAN POTASSIUM 50 MG PO TABS: 50.0000 mg | ORAL_TABLET | Freq: Every day | ORAL | 0 refills | Status: DC

## 2019-09-10 NOTE — Telephone Encounter (Signed)
Patient aware that rx was sent 

## 2019-09-10 NOTE — Telephone Encounter (Signed)
  Prescription Request  09/10/2019  What is the name of the medication or equipment? BP Med  Have you contacted your pharmacy to request a refill? (if applicable) Yes  Which pharmacy would you like this sent to? [facility], [place]  Pt had last appt with Dr Dettinger on 06/27/19. Next appt is on 12/24/19.   Patient notified that their request is being sent to the clinical staff for review and that they should receive a response within 2 business days.

## 2019-12-24 ENCOUNTER — Ambulatory Visit: Payer: BC Managed Care – PPO | Admitting: Family Medicine

## 2020-02-28 ENCOUNTER — Ambulatory Visit (INDEPENDENT_AMBULATORY_CARE_PROVIDER_SITE_OTHER): Payer: BC Managed Care – PPO | Admitting: Nurse Practitioner

## 2020-02-28 ENCOUNTER — Encounter: Payer: Self-pay | Admitting: Nurse Practitioner

## 2020-02-28 ENCOUNTER — Other Ambulatory Visit: Payer: Self-pay

## 2020-02-28 VITALS — BP 138/95 | HR 85 | Temp 97.9°F | Ht 76.0 in | Wt 346.2 lb

## 2020-02-28 DIAGNOSIS — F17200 Nicotine dependence, unspecified, uncomplicated: Secondary | ICD-10-CM

## 2020-02-28 DIAGNOSIS — I10 Essential (primary) hypertension: Secondary | ICD-10-CM | POA: Diagnosis not present

## 2020-02-28 MED ORDER — LOSARTAN POTASSIUM 50 MG PO TABS: 50.0000 mg | ORAL_TABLET | Freq: Every day | ORAL | 1 refills | Status: DC

## 2020-02-28 NOTE — Assessment & Plan Note (Signed)
Patient is following up for blood pressure.  Numbers elevated today due to patient not taking medication as prescribed.  Patient's was administering blood pressure medication skipping a day in between due to not having enough medication.  Provided education with printed handouts given.  Continue to maintain a healthy diet with exercise.  Refill sent to pharmacy.

## 2020-02-28 NOTE — Patient Instructions (Addendum)
Keep a blood pressure log,1-2 weeks, call  with uncontrolled or worsening symptoms. 3 months follow up   Hypertension, Adult Hypertension is another name for high blood pressure. High blood pressure forces your heart to work harder to pump blood. This can cause problems over time. There are two numbers in a blood pressure reading. There is a top number (systolic) over a bottom number (diastolic). It is best to have a blood pressure that is below 120/80. Healthy choices can help lower your blood pressure, or you may need medicine to help lower it. What are the causes? The cause of this condition is not known. Some conditions may be related to high blood pressure. What increases the risk?  Smoking.  Having type 2 diabetes mellitus, high cholesterol, or both.  Not getting enough exercise or physical activity.  Being overweight.  Having too much fat, sugar, calories, or salt (sodium) in your diet.  Drinking too much alcohol.  Having long-term (chronic) kidney disease.  Having a family history of high blood pressure.  Age. Risk increases with age.  Race. You may be at higher risk if you are African American.  Gender. Men are at higher risk than women before age 70. After age 32, women are at higher risk than men.  Having obstructive sleep apnea.  Stress. What are the signs or symptoms?  High blood pressure may not cause symptoms. Very high blood pressure (hypertensive crisis) may cause: ? Headache. ? Feelings of worry or nervousness (anxiety). ? Shortness of breath. ? Nosebleed. ? A feeling of being sick to your stomach (nausea). ? Throwing up (vomiting). ? Changes in how you see. ? Very bad chest pain. ? Seizures. How is this treated?  This condition is treated by making healthy lifestyle changes, such as: ? Eating healthy foods. ? Exercising more. ? Drinking less alcohol.  Your health care provider may prescribe medicine if lifestyle changes are not enough to get  your blood pressure under control, and if: ? Your top number is above 130. ? Your bottom number is above 80.  Your personal target blood pressure may vary. Follow these instructions at home: Eating and drinking   If told, follow the DASH eating plan. To follow this plan: ? Fill one half of your plate at each meal with fruits and vegetables. ? Fill one fourth of your plate at each meal with whole grains. Whole grains include whole-wheat pasta, brown rice, and whole-grain bread. ? Eat or drink low-fat dairy products, such as skim milk or low-fat yogurt. ? Fill one fourth of your plate at each meal with low-fat (lean) proteins. Low-fat proteins include fish, chicken without skin, eggs, beans, and tofu. ? Avoid fatty meat, cured and processed meat, or chicken with skin. ? Avoid pre-made or processed food.  Eat less than 1,500 mg of salt each day.  Do not drink alcohol if: ? Your doctor tells you not to drink. ? You are pregnant, may be pregnant, or are planning to become pregnant.  If you drink alcohol: ? Limit how much you use to:  0-1 drink a day for women.  0-2 drinks a day for men. ? Be aware of how much alcohol is in your drink. In the [place] one drink equals one 12 oz bottle of beer (355 mL), one 5 oz glass of wine (148 mL), or one 1 oz glass of hard liquor (44 mL). Lifestyle   Work with your doctor to stay at a healthy weight or to lose weight.  Ask your doctor what the best weight is for you.  Get at least 30 minutes of exercise most days of the week. This may include walking, swimming, or biking.  Get at least 30 minutes of exercise that strengthens your muscles (resistance exercise) at least 3 days a week. This may include lifting weights or doing Pilates.  Do not use any products that contain nicotine or tobacco, such as cigarettes, e-cigarettes, and chewing tobacco. If you need help quitting, ask your doctor.  Check your blood pressure at home as told by your  doctor.  Keep all follow-up visits as told by your doctor. This is important. Medicines  Take over-the-counter and prescription medicines only as told by your doctor. Follow directions carefully.  Do not skip doses of blood pressure medicine. The medicine does not work as well if you skip doses. Skipping doses also puts you at risk for problems.  Ask your doctor about side effects or reactions to medicines that you should watch for. Contact a doctor if you:  Think you are having a reaction to the medicine you are taking.  Have headaches that keep coming back (recurring).  Feel dizzy.  Have swelling in your ankles.  Have trouble with your vision. Get help right away if you:  Get a very bad headache.  Start to feel mixed up (confused).  Feel weak or numb.  Feel faint.  Have very bad pain in your: ? Chest. ? Belly (abdomen).  Throw up more than once.  Have trouble breathing. Summary  Hypertension is another name for high blood pressure.  High blood pressure forces your heart to work harder to pump blood.  For most people, a normal blood pressure is less than 120/80.  Making healthy choices can help lower blood pressure. If your blood pressure does not get lower with healthy choices, you may need to take medicine. This information is not intended to replace advice given to you by your health care provider. Make sure you discuss any questions you have with your health care provider. Document Revised: 12/28/2017 Document Reviewed: 12/28/2017 Elsevier Patient Education  2020 [employer].

## 2020-02-28 NOTE — Assessment & Plan Note (Signed)
Continue to educate patient on maintaining a healthy diet with exercise regimen.

## 2020-02-28 NOTE — Progress Notes (Addendum)
Established Patient Office Visit  Subjective:  Patient ID: Derek Perez, male    DOB: [birth year]  Age: 39 y.o. MRN: [id number]  CC:  Chief Complaint  Patient presents with  . Hypertension    HPI Derek Perez presents for follow up of hypertension. Patient was diagnosed in 11/09/2017. The patient is tolerating the medication well without side effects. Compliance with treatment has been fair; including taking medication as directed , maintains a healthy diet and regular exercise regimen as tolerated, and following up as directed.  Current medication is Cozaar 50 mg tablet daily.  Patient reports taking 1 tablet every other day due to not having enough medication.  Past Medical History:  Diagnosis Date  . Acne   . Arthritis   . Hyperlipidemia   . Hypertension     Past Surgical History:  Procedure Laterality Date  . ANKLE SURGERY     right  . FRACTURE SURGERY  1998   right ankle    Family History  Problem Relation Age of Onset  . Diabetes Mother   . Hyperlipidemia Mother   . COPD Maternal Grandmother   . Diabetes Maternal Grandmother   . Heart disease Maternal Grandmother   . Cancer Neg Hx   . Stroke Neg Hx     Social History   Socioeconomic History  . Marital status: Married    Spouse name: Not on file  . Number of children: Not on file  . Years of education: Not on file  . Highest education level: Not on file  Occupational History  . Not on file  Tobacco Use  . Smoking status: Current Every Day Smoker    Packs/day: 0.50    Types: Cigarettes  . Smokeless tobacco: Never Used  Vaping Use  . Vaping Use: Never used  Substance and Sexual Activity  . Alcohol use: Never  . Drug use: No  . Sexual activity: Yes    Comment: married for 17 years  Other Topics Concern  . Not on file  Social History Narrative  . Not on file   Social Determinants of Health   Financial Resource Strain:   . Difficulty of Paying Living Expenses: Not on file  Food  Insecurity:   . Worried About Programme researcher, broadcasting/film/video in the Last Year: Not on file  . Ran Out of Food in the Last Year: Not on file  Transportation Needs:   . Lack of Transportation (Medical): Not on file  . Lack of Transportation (Non-Medical): Not on file  Physical Activity:   . Days of Exercise per Week: Not on file  . Minutes of Exercise per Session: Not on file  Stress:   . Feeling of Stress : Not on file  Social Connections:   . Frequency of Communication with Friends and Family: Not on file  . Frequency of Social Gatherings with Friends and Family: Not on file  . Attends Religious Services: Not on file  . Active Member of Clubs or Organizations: Not on file  . Attends Banker Meetings: Not on file  . Marital Status: Not on file  Intimate Partner Violence:   . Fear of Current or Ex-Partner: Not on file  . Emotionally Abused: Not on file  . Physically Abused: Not on file  . Sexually Abused: Not on file    Outpatient Medications Prior to Visit  Medication Sig Dispense Refill  . losartan (COZAAR) 50 MG tablet Take 1 tablet (50 mg total) by mouth daily. 90 tablet 0  .  nicotine (NICODERM CQ - DOSED IN MG/24 HOURS) 14 mg/24hr patch Place 1 patch (14 mg total) onto the skin daily. 28 patch 1   No facility-administered medications prior to visit.    No Known Allergies  ROS Review of Systems  Musculoskeletal: Negative.   Skin: Negative.   Psychiatric/Behavioral: The patient is not nervous/anxious.   All other systems reviewed and are negative.     Objective:    Physical Exam Vitals reviewed.  Constitutional:      Appearance: He is obese.  HENT:     Mouth/Throat:     Mouth: Mucous membranes are moist.  Eyes:     Conjunctiva/sclera: Conjunctivae normal.  Cardiovascular:     Rate and Rhythm: Normal rate and regular rhythm.     Pulses: Normal pulses.     Heart sounds: Normal heart sounds.  Pulmonary:     Effort: Pulmonary effort is normal.     Breath  sounds: Normal breath sounds.  Abdominal:     General: Bowel sounds are normal.  Musculoskeletal:        General: Normal range of motion.  Skin:    General: Skin is warm.  Neurological:     Mental Status: He is alert and oriented to person, place, and time.  Psychiatric:        Mood and Affect: Mood normal.        Behavior: Behavior normal.     BP (!) 138/95   Pulse 85   Temp 97.9 F (36.6 C) (Temporal)   Ht 6\' 4"  (1.93 m)   Wt (!) 346 lb 3.2 oz (157 kg)   SpO2 95%   BMI 42.14 kg/m  Wt Readings from Last 3 Encounters:  02/28/20 (!) 346 lb 3.2 oz (157 kg)  10/25/18 (!) 324 lb 3.2 oz (147.1 kg)  11/09/17 (!) 313 lb (142 kg)      Lab Results  Component Value Date   TSH 1.140 08/05/2014   Lab Results  Component Value Date   WBC 6.2 10/25/2018   HGB 15.4 10/25/2018   HCT 46.5 10/25/2018   MCV 85 10/25/2018   PLT 183 10/25/2018   Lab Results  Component Value Date   NA 141 10/25/2018   K 4.4 10/25/2018   CO2 25 10/25/2018   GLUCOSE 92 10/25/2018   BUN 17 10/25/2018   CREATININE 1.25 10/25/2018   BILITOT 0.5 10/25/2018   ALKPHOS 65 10/25/2018   AST 22 10/25/2018   ALT 22 10/25/2018   PROT 6.8 10/25/2018   ALBUMIN 4.2 10/25/2018   CALCIUM 9.5 10/25/2018   ANIONGAP 5 06/15/2014   Lab Results  Component Value Date   CHOL 188 11/09/2017   Lab Results  Component Value Date   HDL 53 11/09/2017   Lab Results  Component Value Date   LDLCALC 123 (H) 11/09/2017   Lab Results  Component Value Date   TRIG 62 11/09/2017   Lab Results  Component Value Date   CHOLHDL 3.5 11/09/2017   No results found for: HGBA1C    Assessment & Plan:   Problem List Items Addressed This Visit      Cardiovascular and Mediastinum   Hypertension - Primary    Patient is following up for blood pressure.  Numbers elevated today due to patient not taking medication as prescribed.  Patient's was administering blood pressure medication skipping a day in between due to not  having enough medication.  Provided education with printed handouts given.  Continue to maintain a healthy  diet with exercise.  Refill sent to pharmacy.      Relevant Medications   losartan (COZAAR) 50 MG tablet     Other   Needs smoking cessation education   Morbid obesity (HCC)    Continue to educate patient on maintaining a healthy diet with exercise regimen.       Other Visit Diagnoses    Essential hypertension       Relevant Medications   losartan (COZAAR) 50 MG tablet      Meds ordered this encounter  Medications  . losartan (COZAAR) 50 MG tablet    Sig: Take 1 tablet (50 mg total) by mouth daily.    Dispense:  90 tablet    Refill:  1    Follow-up: Return in about 3 months (around 05/30/2020).    Daryll Drown, NP

## 2020-10-13 ENCOUNTER — Telehealth: Payer: Self-pay | Admitting: Family Medicine

## 2020-10-13 DIAGNOSIS — I10 Essential (primary) hypertension: Secondary | ICD-10-CM

## 2020-10-13 MED ORDER — LOSARTAN POTASSIUM 50 MG PO TABS: 50.0000 mg | ORAL_TABLET | Freq: Every day | ORAL | 0 refills | Status: DC

## 2020-10-13 NOTE — Telephone Encounter (Signed)
Pt aware refill sent to pharmacy 

## 2020-10-13 NOTE — Telephone Encounter (Signed)
  Prescription Request  10/13/2020  What is the name of the medication or equipment? losartan  Have you contacted your pharmacy to request a refill? (if applicable) yes  Which pharmacy would you like this sent to? [facility] in [place]  Appt made for 7/21   Patient notified that their request is being sent to the clinical staff for review and that they should receive a response within 2 business days.

## 2020-11-20 ENCOUNTER — Ambulatory Visit: Payer: BC Managed Care – PPO | Admitting: Family Medicine

## 2020-12-31 ENCOUNTER — Ambulatory Visit: Payer: Self-pay | Admitting: Family Medicine

## 2021-01-13 ENCOUNTER — Ambulatory Visit (INDEPENDENT_AMBULATORY_CARE_PROVIDER_SITE_OTHER): Payer: BC Managed Care – PPO | Admitting: Family Medicine

## 2021-01-13 ENCOUNTER — Other Ambulatory Visit: Payer: Self-pay

## 2021-01-13 ENCOUNTER — Encounter: Payer: Self-pay | Admitting: Family Medicine

## 2021-01-13 VITALS — BP 132/91 | HR 93 | Ht 76.0 in | Wt 368.0 lb

## 2021-01-13 DIAGNOSIS — I10 Essential (primary) hypertension: Secondary | ICD-10-CM | POA: Diagnosis not present

## 2021-01-13 DIAGNOSIS — Z1159 Encounter for screening for other viral diseases: Secondary | ICD-10-CM

## 2021-01-13 DIAGNOSIS — Z114 Encounter for screening for human immunodeficiency virus [HIV]: Secondary | ICD-10-CM

## 2021-01-13 MED ORDER — LOSARTAN POTASSIUM 50 MG PO TABS: 50.0000 mg | ORAL_TABLET | Freq: Every day | ORAL | 3 refills | Status: DC

## 2021-01-13 NOTE — Progress Notes (Signed)
BP (!) 132/91   Pulse 93   Ht 6' 4"  (1.93 m)   Wt (!) 368 lb (166.9 kg)   SpO2 96%   BMI 44.79 kg/m    Subjective:   Patient ID: Derek Perez, male    DOB: November 20, 1980, 40 y.o.   MRN: [id number]  HPI: Derek Perez is a 40 y.o. male presenting on 01/13/2021 for Medical Management of Chronic Issues and Hypertension   HPI Hypertension Patient is currently on losartan, and their blood pressure today is 132/91. Patient denies any lightheadedness or dizziness. Patient denies headaches, blurred vision, chest pains, shortness of breath, or weakness. Denies any side effects from medication and is content with current medication.   Patient's Hypertension are more complicated by the patient's morbid obesity.  Discussed weight loss and lifestyle modification and exercise with the patient.   Relevant past medical, surgical, family and social history reviewed and updated as indicated. Interim medical history since our last visit reviewed. Allergies and medications reviewed and updated.  Review of Systems  Constitutional:  Negative for chills and fever.  Eyes:  Negative for visual disturbance.  Respiratory:  Negative for shortness of breath and wheezing.   Cardiovascular:  Negative for chest pain and leg swelling.  Musculoskeletal:  Negative for back pain and gait problem.  Skin:  Negative for rash.  Neurological:  Negative for dizziness, weakness and light-headedness.  All other systems reviewed and are negative.  Per HPI unless specifically indicated above   Allergies as of 01/13/2021   No Known Allergies      Medication List        Accurate as of January 13, 2021  9:50 AM. If you have any questions, ask your nurse or doctor.          STOP taking these medications    nicotine 14 mg/24hr patch Commonly known as: NICODERM CQ - dosed in mg/24 hours Stopped by: Fransisca Kaufmann Ronin Crager, MD       TAKE these medications    losartan 50 MG tablet Commonly known as:  COZAAR Take 1 tablet (50 mg total) by mouth daily.         Objective:   BP (!) 132/91   Pulse 93   Ht 6' 4"  (1.93 m)   Wt (!) 368 lb (166.9 kg)   SpO2 96%   BMI 44.79 kg/m   Wt Readings from Last 3 Encounters:  01/13/21 (!) 368 lb (166.9 kg)  02/28/20 (!) 346 lb 3.2 oz (157 kg)  10/25/18 (!) 324 lb 3.2 oz (147.1 kg)    Physical Exam Vitals and nursing note reviewed.  Constitutional:      General: He is not in acute distress.    Appearance: He is well-developed. He is not diaphoretic.  Eyes:     General: No scleral icterus.    Conjunctiva/sclera: Conjunctivae normal.  Neck:     Thyroid: No thyromegaly.  Cardiovascular:     Rate and Rhythm: Normal rate and regular rhythm.     Heart sounds: Normal heart sounds. No murmur heard. Pulmonary:     Effort: Pulmonary effort is normal. No respiratory distress.     Breath sounds: Normal breath sounds. No wheezing.  Musculoskeletal:        General: Normal range of motion.     Cervical back: Neck supple.  Lymphadenopathy:     Cervical: No cervical adenopathy.  Skin:    General: Skin is warm and dry.     Findings: No rash.  Neurological:  Mental Status: He is alert and oriented to person, place, and time.     Coordination: Coordination normal.  Psychiatric:        Behavior: Behavior normal.      Assessment & Plan:   Problem List Items Addressed This Visit       Cardiovascular and Mediastinum   Hypertension - Primary   Relevant Medications   losartan (COZAAR) 50 MG tablet   Other Relevant Orders   CBC with Differential/Platelet   CMP14+EGFR   Lipid panel     Other   Morbid obesity (HCC)   Relevant Orders   Lipid panel   Other Visit Diagnoses     Need for hepatitis C screening test       Relevant Orders   Hepatitis C antibody   Screening for HIV without presence of risk factors       Relevant Orders   HIV Antibody (routine testing w rflx)       Continue losartan, monitor blood pressure closely,  slightly elevated on the diastolic. Follow up plan: Return if symptoms worsen or fail to improve, for 3 to 6 months hypertension recheck.  Counseling provided for all of the vaccine components Orders Placed This Encounter  Procedures   CBC with Differential/Platelet   CMP14+EGFR   Lipid panel   HIV Antibody (routine testing w rflx)   Hepatitis C antibody     Caryl Pina, MD [facility] 01/13/2021, 9:50 AM

## 2021-01-14 LAB — CMP14+EGFR
ALT: 19 IU/L (ref 0–44)
AST: 22 IU/L (ref 0–40)
Albumin/Globulin Ratio: 1.6 (ref 1.2–2.2)
Albumin: 4.2 g/dL (ref 4.0–5.0)
Alkaline Phosphatase: 69 IU/L (ref 44–121)
BUN/Creatinine Ratio: 9 (ref 9–20)
BUN: 11 mg/dL (ref 6–24)
Bilirubin Total: 0.6 mg/dL (ref 0.0–1.2)
CO2: 24 mmol/L (ref 20–29)
Calcium: 9.4 mg/dL (ref 8.7–10.2)
Chloride: 104 mmol/L (ref 96–106)
Creatinine, Ser: 1.24 mg/dL (ref 0.76–1.27)
Globulin, Total: 2.7 g/dL (ref 1.5–4.5)
Glucose: 97 mg/dL (ref 65–99)
Potassium: 4.6 mmol/L (ref 3.5–5.2)
Sodium: 142 mmol/L (ref 134–144)
Total Protein: 6.9 g/dL (ref 6.0–8.5)
eGFR: 75 mL/min/{1.73_m2} (ref >59)

## 2021-01-14 LAB — LIPID PANEL
Chol/HDL Ratio: 4 ratio (ref 0.0–5.0)
Cholesterol, Total: 189 mg/dL (ref 100–199)
HDL: 47 mg/dL (ref >39)
LDL Chol Calc (NIH): 128 mg/dL — ABNORMAL HIGH (ref 0–99)
Triglycerides: 75 mg/dL (ref 0–149)
VLDL Cholesterol Cal: 14 mg/dL (ref 5–40)

## 2021-01-14 LAB — CBC WITH DIFFERENTIAL/PLATELET
Basophils Absolute: 0 10*3/uL (ref 0.0–0.2)
Basos: 1 %
EOS (ABSOLUTE): 0.2 10*3/uL (ref 0.0–0.4)
Eos: 3 %
Hematocrit: 45.5 % (ref 37.5–51.0)
Hemoglobin: 14.8 g/dL (ref 13.0–17.7)
Immature Grans (Abs): 0 10*3/uL (ref 0.0–0.1)
Immature Granulocytes: 0 %
Lymphocytes Absolute: 1.2 10*3/uL (ref 0.7–3.1)
Lymphs: 24 %
MCH: 27.2 pg (ref 26.6–33.0)
MCHC: 32.5 g/dL (ref 31.5–35.7)
MCV: 84 fL (ref 79–97)
Monocytes Absolute: 0.5 10*3/uL (ref 0.1–0.9)
Monocytes: 10 %
Neutrophils Absolute: 3 10*3/uL (ref 1.4–7.0)
Neutrophils: 62 %
Platelets: 185 10*3/uL (ref 150–450)
RBC: 5.44 x10E6/uL (ref 4.14–5.80)
RDW: 13.6 % (ref 11.6–15.4)
WBC: 4.8 10*3/uL (ref 3.4–10.8)

## 2021-01-14 LAB — HEPATITIS C ANTIBODY: Hep C Virus Ab: 1.1 s/co ratio — ABNORMAL HIGH (ref 0.0–0.9)

## 2021-01-14 LAB — HIV ANTIBODY (ROUTINE TESTING W REFLEX): HIV Screen 4th Generation wRfx: NONREACTIVE

## 2021-01-15 ENCOUNTER — Other Ambulatory Visit: Payer: Self-pay | Admitting: Family Medicine

## 2021-01-15 DIAGNOSIS — R768 Other specified abnormal immunological findings in serum: Secondary | ICD-10-CM

## 2021-01-15 MED ORDER — ATORVASTATIN CALCIUM 20 MG PO TABS
20.0000 mg | ORAL_TABLET | Freq: Every day | ORAL | 3 refills | Status: DC
Start: 2021-01-15 — End: 2022-03-18

## 2021-01-16 ENCOUNTER — Other Ambulatory Visit: Payer: Self-pay

## 2021-01-16 ENCOUNTER — Other Ambulatory Visit: Payer: BC Managed Care – PPO

## 2021-01-16 DIAGNOSIS — R768 Other specified abnormal immunological findings in serum: Secondary | ICD-10-CM

## 2021-01-16 DIAGNOSIS — R6889 Other general symptoms and signs: Secondary | ICD-10-CM | POA: Diagnosis not present

## 2021-01-18 LAB — HCV RNA QUANT: Hepatitis C Quantitation: NOT DETECTED IU/mL

## 2021-07-10 ENCOUNTER — Ambulatory Visit: Payer: BC Managed Care – PPO | Admitting: Family Medicine

## 2021-07-30 ENCOUNTER — Encounter: Payer: Self-pay | Admitting: Family Medicine

## 2021-07-30 ENCOUNTER — Ambulatory Visit (INDEPENDENT_AMBULATORY_CARE_PROVIDER_SITE_OTHER): Payer: BC Managed Care – PPO | Admitting: Family Medicine

## 2021-07-30 VITALS — BP 138/96 | HR 83 | Ht 76.0 in | Wt 356.0 lb

## 2021-07-30 DIAGNOSIS — I10 Essential (primary) hypertension: Secondary | ICD-10-CM | POA: Diagnosis not present

## 2021-07-30 DIAGNOSIS — E782 Mixed hyperlipidemia: Secondary | ICD-10-CM

## 2021-07-30 NOTE — Progress Notes (Signed)
? ?BP (!) 138/96   Pulse 83   Ht 6' 4"  (1.93 m)   Wt (!) 356 lb (161.5 kg)   SpO2 97%   BMI 43.33 kg/m?   ? ?Subjective:  ? ?Patient ID: Derek Perez, male    DOB: 12-03-1980, 41 y.o.   MRN: [id number] ? ?HPI: ?Derek Perez is a 41 y.o. male presenting on 07/30/2021 for Medical Management of Chronic Issues, Hyperlipidemia, and Hypertension ? ? ?HPI ?Hypertension ?Patient is currently on losartan, and their blood pressure today is 138/96. Patient denies any lightheadedness or dizziness. Patient denies headaches, blurred vision, chest pains, shortness of breath, or weakness. Denies any side effects from medication and is content with current medication.  He does admit that he did not take his blood pressure pill yet today because he went to sleep after work and usually wakes up and takes it around this time. ? ?Hyperlipidemia ?Patient is coming in for recheck of his hyperlipidemia. The patient is currently taking atorvastatin. They deny any issues with myalgias or history of liver damage from it. They deny any focal numbness or weakness or chest pain.  ? ?Relevant past medical, surgical, family and social history reviewed and updated as indicated. Interim medical history since our last visit reviewed. ?Allergies and medications reviewed and updated. ? ?Review of Systems  ?Constitutional:  Negative for chills and fever.  ?Eyes:  Negative for visual disturbance.  ?Respiratory:  Negative for shortness of breath and wheezing.   ?Cardiovascular:  Negative for chest pain and leg swelling.  ?Musculoskeletal:  Negative for back pain and gait problem.  ?Skin:  Negative for rash.  ?Neurological:  Negative for dizziness, weakness and light-headedness.  ?All other systems reviewed and are negative. ? ?Per HPI unless specifically indicated above ? ? ?Allergies as of 07/30/2021   ?No Known Allergies ?  ? ?  ?Medication List  ?  ? ?  ? Accurate as of July 30, 2021 11:08 AM. If you have any questions, ask your nurse or  doctor.  ?  ?  ? ?  ? ?atorvastatin 20 MG tablet ?Commonly known as: LIPITOR ?Take 1 tablet (20 mg total) by mouth daily. ?  ?losartan 50 MG tablet ?Commonly known as: COZAAR ?Take 1 tablet (50 mg total) by mouth daily. ?  ? ?  ? ? ? ?Objective:  ? ?BP (!) 138/96   Pulse 83   Ht 6' 4"  (1.93 m)   Wt (!) 356 lb (161.5 kg)   SpO2 97%   BMI 43.33 kg/m?   ?Wt Readings from Last 3 Encounters:  ?07/30/21 (!) 356 lb (161.5 kg)  ?01/13/21 (!) 368 lb (166.9 kg)  ?02/28/20 (!) 346 lb 3.2 oz (157 kg)  ?  ?Physical Exam ?Vitals and nursing note reviewed.  ?Constitutional:   ?   General: He is not in acute distress. ?   Appearance: He is well-developed. He is not diaphoretic.  ?Eyes:  ?   General: No scleral icterus. ?   Conjunctiva/sclera: Conjunctivae normal.  ?Neck:  ?   Thyroid: No thyromegaly.  ?Cardiovascular:  ?   Rate and Rhythm: Normal rate and regular rhythm.  ?   Heart sounds: Normal heart sounds. No murmur heard. ?Pulmonary:  ?   Effort: Pulmonary effort is normal. No respiratory distress.  ?   Breath sounds: Normal breath sounds. No wheezing.  ?Musculoskeletal:     ?   General: Normal range of motion.  ?   Cervical back: Neck supple.  ?Lymphadenopathy:  ?  Cervical: No cervical adenopathy.  ?Skin: ?   General: Skin is warm and dry.  ?   Findings: No rash.  ?Neurological:  ?   Mental Status: He is alert and oriented to person, place, and time.  ?   Coordination: Coordination normal.  ?Psychiatric:     ?   Behavior: Behavior normal.  ? ? ? ? ?Assessment & Plan:  ? ?Problem List Items Addressed This Visit   ? ?  ? Cardiovascular and Mediastinum  ? Hypertension - Primary  ? Relevant Orders  ? CBC with Differential/Platelet  ? CMP14+EGFR  ? Lipid panel  ?  ? Other  ? Mixed hyperlipidemia  ?  ?Continue current medicine, no changes. ? ?Blood pressure slightly elevated, I will have him monitor at home over the next couple weeks and call us in some numbers and see where he is running at home.   ?Follow up plan: ?Return  in about 6 months (around 01/30/2022), or if symptoms worsen or fail to improve, for Hypertension and cholesterol and physical. ? ?Counseling provided for all of the vaccine components ?Orders Placed This Encounter  ?Procedures  ? CBC with Differential/Platelet  ? CMP14+EGFR  ? Lipid panel  ? ? ?Caryl Pina, MD ?[place] ?07/30/2021, 11:08 AM ? ? ? ? ?

## 2021-07-31 LAB — LIPID PANEL
Chol/HDL Ratio: 3.2 ratio (ref 0.0–5.0)
Cholesterol, Total: 152 mg/dL (ref 100–199)
HDL: 47 mg/dL (ref >39)
LDL Chol Calc (NIH): 88 mg/dL (ref 0–99)
Triglycerides: 89 mg/dL (ref 0–149)
VLDL Cholesterol Cal: 17 mg/dL (ref 5–40)

## 2021-07-31 LAB — CBC WITH DIFFERENTIAL/PLATELET
Basophils Absolute: 0 10*3/uL (ref 0.0–0.2)
Basos: 1 %
EOS (ABSOLUTE): 0.2 10*3/uL (ref 0.0–0.4)
Eos: 3 %
Hematocrit: 43.4 % (ref 37.5–51.0)
Hemoglobin: 14.6 g/dL (ref 13.0–17.7)
Immature Grans (Abs): 0 10*3/uL (ref 0.0–0.1)
Immature Granulocytes: 0 %
Lymphocytes Absolute: 1.3 10*3/uL (ref 0.7–3.1)
Lymphs: 28 %
MCH: 27.5 pg (ref 26.6–33.0)
MCHC: 33.6 g/dL (ref 31.5–35.7)
MCV: 82 fL (ref 79–97)
Monocytes Absolute: 0.6 10*3/uL (ref 0.1–0.9)
Monocytes: 13 %
Neutrophils Absolute: 2.6 10*3/uL (ref 1.4–7.0)
Neutrophils: 55 %
Platelets: 177 10*3/uL (ref 150–450)
RBC: 5.3 x10E6/uL (ref 4.14–5.80)
RDW: 13.1 % (ref 11.6–15.4)
WBC: 4.7 10*3/uL (ref 3.4–10.8)

## 2021-07-31 LAB — CMP14+EGFR
ALT: 19 IU/L (ref 0–44)
AST: 19 IU/L (ref 0–40)
Albumin/Globulin Ratio: 1.4 (ref 1.2–2.2)
Albumin: 4.1 g/dL (ref 4.0–5.0)
Alkaline Phosphatase: 74 IU/L (ref 44–121)
BUN/Creatinine Ratio: 14 (ref 9–20)
BUN: 16 mg/dL (ref 6–24)
Bilirubin Total: 0.4 mg/dL (ref 0.0–1.2)
CO2: 25 mmol/L (ref 20–29)
Calcium: 9.5 mg/dL (ref 8.7–10.2)
Chloride: 104 mmol/L (ref 96–106)
Creatinine, Ser: 1.13 mg/dL (ref 0.76–1.27)
Globulin, Total: 2.9 g/dL (ref 1.5–4.5)
Glucose: 92 mg/dL (ref 70–99)
Potassium: 4.5 mmol/L (ref 3.5–5.2)
Sodium: 140 mmol/L (ref 134–144)
Total Protein: 7 g/dL (ref 6.0–8.5)
eGFR: 83 mL/min/{1.73_m2} (ref >59)

## 2021-08-12 DIAGNOSIS — L0291 Cutaneous abscess, unspecified: Secondary | ICD-10-CM | POA: Diagnosis not present

## 2021-08-17 DIAGNOSIS — L0291 Cutaneous abscess, unspecified: Secondary | ICD-10-CM | POA: Diagnosis not present

## 2021-08-17 DIAGNOSIS — L309 Dermatitis, unspecified: Secondary | ICD-10-CM | POA: Diagnosis not present

## 2021-08-25 DIAGNOSIS — L0291 Cutaneous abscess, unspecified: Secondary | ICD-10-CM | POA: Diagnosis not present

## 2021-08-25 DIAGNOSIS — L309 Dermatitis, unspecified: Secondary | ICD-10-CM | POA: Diagnosis not present

## 2022-02-04 ENCOUNTER — Ambulatory Visit: Payer: BC Managed Care – PPO | Admitting: Family Medicine

## 2022-03-02 ENCOUNTER — Telehealth (INDEPENDENT_AMBULATORY_CARE_PROVIDER_SITE_OTHER): Payer: BC Managed Care – PPO | Admitting: Nurse Practitioner

## 2022-03-02 ENCOUNTER — Encounter: Payer: Self-pay | Admitting: Nurse Practitioner

## 2022-03-02 DIAGNOSIS — U071 COVID-19: Secondary | ICD-10-CM | POA: Diagnosis not present

## 2022-03-02 MED ORDER — GUAIFENESIN ER 600 MG PO TB12: 600.0000 mg | ORAL_TABLET | Freq: Two times a day (BID) | ORAL | 0 refills | Status: DC

## 2022-03-02 MED ORDER — ACETAMINOPHEN 500 MG PO TABS: 500.0000 mg | ORAL_TABLET | Freq: Four times a day (QID) | ORAL | 0 refills | Status: AC | PRN

## 2022-03-02 MED ORDER — NIRMATRELVIR/RITONAVIR (PAXLOVID)TABLET
3.0000 | ORAL_TABLET | Freq: Two times a day (BID) | ORAL | 0 refills | Status: AC
Start: 2022-03-02 — End: 2022-03-07

## 2022-03-02 NOTE — Progress Notes (Signed)
   Virtual Visit  Note Due to COVID-19 pandemic this visit was conducted virtually. This visit type was conducted due to national recommendations for restrictions regarding the COVID-19 Pandemic (e.g. social distancing, sheltering in place) in an effort to limit this patient's exposure and mitigate transmission in our community. All issues noted in this document were discussed and addressed.  A physical exam was not performed with this format.  I connected with Derek Perez on 03/02/22 at 12:12 pm  by telephone and verified that I am speaking with the correct person using two identifiers. Derek Perez is currently located at home during visit. The provider, Ivy Lynn, NP is located in their office at time of visit.  I discussed the limitations, risks, security and privacy concerns of performing an evaluation and management service by telephone and the availability of in person appointments. I also discussed with the patient that there may be a patient responsible charge related to this service. The patient expressed understanding and agreed to proceed.   History and Present Illness:  URI  This is a new problem. The current episode started today. The problem has been unchanged. There has been no fever. Associated symptoms include congestion, coughing and headaches. Pertinent negatives include no abdominal pain, joint swelling, nausea, rash, sinus pain, sneezing, sore throat or wheezing. He has tried nothing for the symptoms.  Patient positive for Covid-19, symptoms presented 24 hours ago. Nasal swab returned positive this morning.     Review of Systems  HENT:  Positive for congestion. Negative for sinus pain, sneezing and sore throat.   Respiratory:  Positive for cough. Negative for wheezing.   Gastrointestinal:  Negative for abdominal pain and nausea.  Skin:  Negative for rash.  Neurological:  Positive for headaches.     Observations/Objective: Tele-visit patient not in  distress.   Assessment and Plan: Positive Covid-19, education provided to patient over the phone on use of antiviral. Patient verbalized understanding. Paxlovid sent to pharmacy Take meds as prescribed - Use a cool mist humidifier  -Use saline nose sprays frequently -Force fluids -For fever or aches or pains- take Tylenol or ibuprofen. -If symptoms do not improve, he may need to be COVID tested to rule this out   Follow Up Instructions:  Follow up with worsening unresolved symptoms    I discussed the assessment and treatment plan with the patient. The patient was provided an opportunity to ask questions and all were answered. The patient agreed with the plan and demonstrated an understanding of the instructions.   The patient was advised to call back or seek an in-person evaluation if the symptoms worsen or if the condition fails to improve as anticipated.  The above assessment and management plan was discussed with the patient. The patient verbalized understanding of and has agreed to the management plan. Patient is aware to call the clinic if symptoms persist or worsen. Patient is aware when to return to the clinic for a follow-up visit. Patient educated on when it is appropriate to go to the emergency department.   Time call ended: 12:24 pm   I provided 12 minutes of  non face-to-face time during this encounter.    Ivy Lynn, NP

## 2022-03-18 ENCOUNTER — Ambulatory Visit (INDEPENDENT_AMBULATORY_CARE_PROVIDER_SITE_OTHER): Payer: BC Managed Care – PPO | Admitting: Family Medicine

## 2022-03-18 ENCOUNTER — Encounter: Payer: Self-pay | Admitting: Family Medicine

## 2022-03-18 VITALS — BP 133/77 | HR 77 | Ht 76.0 in | Wt 353.0 lb

## 2022-03-18 DIAGNOSIS — Z0001 Encounter for general adult medical examination with abnormal findings: Secondary | ICD-10-CM

## 2022-03-18 DIAGNOSIS — E782 Mixed hyperlipidemia: Secondary | ICD-10-CM

## 2022-03-18 DIAGNOSIS — Z0000 Encounter for general adult medical examination without abnormal findings: Secondary | ICD-10-CM | POA: Diagnosis not present

## 2022-03-18 DIAGNOSIS — M654 Radial styloid tenosynovitis [de Quervain]: Secondary | ICD-10-CM

## 2022-03-18 DIAGNOSIS — I10 Essential (primary) hypertension: Secondary | ICD-10-CM

## 2022-03-18 MED ORDER — ATORVASTATIN CALCIUM 20 MG PO TABS: 20.0000 mg | ORAL_TABLET | Freq: Every day | ORAL | 3 refills | Status: DC

## 2022-03-18 MED ORDER — LOSARTAN POTASSIUM 50 MG PO TABS: 50.0000 mg | ORAL_TABLET | Freq: Every day | ORAL | 3 refills | Status: DC

## 2022-03-18 NOTE — Progress Notes (Signed)
BP 133/77   Pulse 77   Ht _0  (1.93 m)   Wt (!) 353 lb (160.1 kg)   SpO2 98%   BMI 42.97 kg/m    Subjective:   Patient ID: Derek Perez, male    DOB: 08/20/1980, 41 y.o.   MRN: [id number]  HPI: Derek Perez is a 41 y.o. male presenting on 03/18/2022 for Medical Management of Chronic Issues (CPE), Hypertension, and Hyperlipidemia  Physical exam Patient denies any chest pain, shortness of breath, headaches or vision issues, abdominal complaints, diarrhea, nausea, vomiting, or joint issues.   General  Tobacco Cessation Patient reports that things are going well overall. No changes to diet or exercise. Significant stress at home, as his wife has Crohn's disease and is currently on TPN.   He has stopped smoking (Since January 2023). Works at [employer] and reports that he is not feeling any urges to smoke. He is vaping three times a week.   Thenar Pain  Patient reports significant pain and tenderness at the radial side of the wrist. Notices the pain when holding or gripping objects on the affected side. Pain radiates to the thumb and wrist. Has not tried anything to make the pain better.     Hypertension  Hyperlipidemia  Patient continues to be compliant with his medications, including atorvastatin and losartan. Does not report chest pain or shortness of breath. No lower extremity edema. Works the second shift and usually eats whatever is convenient. Does not eat while on shift. Has not been checking blood pressures at home.   Relevant past medical, surgical, family and social history reviewed and updated as indicated. Interim medical history since our last visit reviewed. Allergies and medications reviewed and updated.  Review of Systems  Review of Systems  Constitutional:  Negative for activity change, appetite change, chills, diaphoresis, fatigue, fever and unexpected weight change.  HENT:  Negative for congestion, ear pain, facial swelling, sneezing and  sore throat.   Respiratory:  Negative for cough, choking, chest tightness and shortness of breath.   Cardiovascular:  Negative for chest pain, palpitations and leg swelling.  Gastrointestinal:  Negative for blood in stool, constipation, diarrhea and nausea.  Endocrine: Negative for cold intolerance and heat intolerance.  Genitourinary:  Negative for difficulty urinating, dysuria, or urgency  Musculoskeletal:  Negative for arthralgias and back pain. Pain in right thumb.  Skin:  Negative for color change and pallor.  Neurological:  Negative for dizziness, numbness and headaches.   Per HPI unless specifically indicated above  Allergies as of 03/18/2022   No Known Allergies      Medication List        Accurate as of March 18, 2022 11:59 PM. If you have any questions, ask your nurse or doctor.          STOP taking these medications    guaiFENesin 600 MG 12 hr tablet Commonly known as: Mucinex Stopped by: Fransisca Kaufmann Kani Chauvin, MD       TAKE these medications    acetaminophen 500 MG tablet Commonly known as: TYLENOL Take 1 tablet (500 mg total) by mouth every 6 (six) hours as needed.   atorvastatin 20 MG tablet Commonly known as: LIPITOR Take 1 tablet (20 mg total) by mouth daily.   losartan 50 MG tablet Commonly known as: COZAAR Take 1 tablet (50 mg total) by mouth daily.         Objective:   BP 133/77   Pulse 77   Ht  _0  (1.93 m)   Wt (!) 353 lb (160.1 kg)   SpO2 98%   BMI 42.97 kg/m   Wt Readings from Last 3 Encounters:  03/18/22 (!) 353 lb (160.1 kg)  07/30/21 (!) 356 lb (161.5 kg)  01/13/21 (!) 368 lb (166.9 kg)    Physical Exam  Physical Examination:  General: Awake, alert, well nourished, No acute distress HEENT: Normal    Neck: No masses palpated. No lymphadenopathy    Ears: Tympanic membranes intact, normal light reflex, no erythema, no bulging    Eyes: PERRLA, extraocular membranes intact, sclera white    Nose: nasal turbinates moist,  no nasal discharge    Throat: moist mucus membranes, no erythema, no tonsillar exudate.  Airway is patent  There is no fluid wave, hepatomegaly, splenomegaly, or masses.  Cardio: regular rate and rhythm, S1S2 heard, no murmurs appreciated Pulm: clear to auscultation bilaterally, no wheezes, rhonchi or rales; normal work of breathing on room air GI: soft, non-tender, non-distended, bowel sounds present x4, no hepatomegaly, no splenomegaly, no masses Extremities: warm, well perfused, No edema, cyanosis or clubbing; +1 pulses bilaterally Skin: dry; intact; no rashes or lesions. Slight swelling of the posterior thumb over the extensor pollicis longus tendon.       Assessment & Plan:   Problem List Items Addressed This Visit       Cardiovascular and Mediastinum   Hypertension   Relevant Medications   losartan (COZAAR) 50 MG tablet   atorvastatin (LIPITOR) 20 MG tablet   Other Relevant Orders   CBC with Differential/Platelet (Completed)   CMP14+EGFR (Completed)   Lipid panel (Completed)     Other   Mixed hyperlipidemia   Relevant Medications   losartan (COZAAR) 50 MG tablet   atorvastatin (LIPITOR) 20 MG tablet   Other Relevant Orders   CBC with Differential/Platelet (Completed)   CMP14+EGFR (Completed)   Lipid panel (Completed)   Other Visit Diagnoses     Physical exam    -  Primary   Relevant Orders   CBC with Differential/Platelet (Completed)   CMP14+EGFR (Completed)   Lipid panel (Completed)   Tenosynovitis, de Quervain             Assessment:   No changes to medications at this time. Discussed that patient should check his blood pressures in advance of his next appointment, as his blood pressure was initially high, then normalized on re-check.   Applauded patient regarding recent tobacco cessation. This is great and we discussed that he can hopefully decrease his vape use over the next year as well.   Patient had a positive Finkelstein test on the physical exam  today. 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 Counseled the patient to utilize Voltaren gel to decrease inflammation over that area. Also counseled the patient to figure out what repetitive motion at work or at home is causing irritation to that area. Discussed the importance of using the whole wrist to pull, push, and lift objects. The patient can utilize a splint to support that area, but counseled that the pressure of the splint can sometimes worsen pain if it does not fit right. If no improvement or worsening pain, we can discuss corticosteroid injection over that area.  Will check lipid levels, CMP to monitor kidney function, and CBC today. Patient expressed understanding of the plan.  Patient   Follow up plan: Return in about 6 months (around 09/16/2022), or if symptoms worsen or fail to improve,  for htn and hld.  Counseling provided for all of the vaccine components Orders Placed This Encounter  Procedures   CBC with Differential/Platelet   CMP14+EGFR   Lipid panel    Stephani Police, MS3 03/24/2022, 3:22 PM  Patient seen and examined with medical student, agree with assessment and plan above.  Patient is going to try Voltaren gel rather than injections and see if he can improve on the wrist.  He is also going to focus on movements that may irritate it better repetitive and modify behavior Caryl Pina, MD [facility] 03/24/2022, 3:24 PM

## 2022-03-19 LAB — CBC WITH DIFFERENTIAL/PLATELET
Basophils Absolute: 0 10*3/uL (ref 0.0–0.2)
Basos: 1 %
EOS (ABSOLUTE): 0.1 10*3/uL (ref 0.0–0.4)
Eos: 2 %
Hematocrit: 44.3 % (ref 37.5–51.0)
Hemoglobin: 13.9 g/dL (ref 13.0–17.7)
Immature Grans (Abs): 0 10*3/uL (ref 0.0–0.1)
Immature Granulocytes: 0 %
Lymphocytes Absolute: 1.1 10*3/uL (ref 0.7–3.1)
Lymphs: 20 %
MCH: 26.6 pg (ref 26.6–33.0)
MCHC: 31.4 g/dL — ABNORMAL LOW (ref 31.5–35.7)
MCV: 85 fL (ref 79–97)
Monocytes Absolute: 0.6 10*3/uL (ref 0.1–0.9)
Monocytes: 11 %
Neutrophils Absolute: 3.7 10*3/uL (ref 1.4–7.0)
Neutrophils: 66 %
Platelets: 184 10*3/uL (ref 150–450)
RBC: 5.22 x10E6/uL (ref 4.14–5.80)
RDW: 13.3 % (ref 11.6–15.4)
WBC: 5.4 10*3/uL (ref 3.4–10.8)

## 2022-03-19 LAB — CMP14+EGFR
ALT: 16 IU/L (ref 0–44)
AST: 20 IU/L (ref 0–40)
Albumin/Globulin Ratio: 1.5 (ref 1.2–2.2)
Albumin: 4.2 g/dL (ref 4.1–5.1)
Alkaline Phosphatase: 75 IU/L (ref 44–121)
BUN/Creatinine Ratio: 8 — ABNORMAL LOW (ref 9–20)
BUN: 10 mg/dL (ref 6–24)
Bilirubin Total: 0.8 mg/dL (ref 0.0–1.2)
CO2: 27 mmol/L (ref 20–29)
Calcium: 9.7 mg/dL (ref 8.7–10.2)
Chloride: 105 mmol/L (ref 96–106)
Creatinine, Ser: 1.18 mg/dL (ref 0.76–1.27)
Globulin, Total: 2.8 g/dL (ref 1.5–4.5)
Glucose: 105 mg/dL — ABNORMAL HIGH (ref 70–99)
Potassium: 4.7 mmol/L (ref 3.5–5.2)
Sodium: 145 mmol/L — ABNORMAL HIGH (ref 134–144)
Total Protein: 7 g/dL (ref 6.0–8.5)
eGFR: 79 mL/min/{1.73_m2} (ref >59)

## 2022-03-19 LAB — LIPID PANEL
Chol/HDL Ratio: 2.8 ratio (ref 0.0–5.0)
Cholesterol, Total: 150 mg/dL (ref 100–199)
HDL: 53 mg/dL (ref >39)
LDL Chol Calc (NIH): 83 mg/dL (ref 0–99)
Triglycerides: 68 mg/dL (ref 0–149)
VLDL Cholesterol Cal: 14 mg/dL (ref 5–40)

## 2022-07-06 ENCOUNTER — Telehealth: Payer: Self-pay | Admitting: Family Medicine

## 2022-07-06 NOTE — Telephone Encounter (Signed)
Yes he is going to get the paperwork sent to [place], it is something similar to [facility] paperwork but we will watch for that.  If he would like an appointment for himself for mental health check then please see if we can get him in for a sooner rather than later appointment.

## 2022-07-06 NOTE — Telephone Encounter (Signed)
Patients wife calling back. Please call.

## 2022-07-06 NOTE — Telephone Encounter (Signed)
Patients wife stated that patient needs a mental health check and needs paperwork for his work filled out by PCP. They spoke with PCP about it yesterday (3/4) when the daughter was here for an appointment.

## 2022-07-07 ENCOUNTER — Encounter: Payer: Self-pay | Admitting: *Deleted

## 2022-07-07 NOTE — Telephone Encounter (Signed)
Please call patient and schedule next available appt for mental health evaluation.

## 2022-07-08 ENCOUNTER — Encounter: Payer: Self-pay | Admitting: Family Medicine

## 2022-07-08 ENCOUNTER — Ambulatory Visit (INDEPENDENT_AMBULATORY_CARE_PROVIDER_SITE_OTHER): Payer: BC Managed Care – PPO | Admitting: Family Medicine

## 2022-07-08 VITALS — BP 158/97 | HR 77 | Ht 76.0 in | Wt 353.0 lb

## 2022-07-08 DIAGNOSIS — F321 Major depressive disorder, single episode, moderate: Secondary | ICD-10-CM | POA: Diagnosis not present

## 2022-07-08 MED ORDER — SERTRALINE HCL 50 MG PO TABS: 50.0000 mg | ORAL_TABLET | Freq: Every day | ORAL | 3 refills | Status: DC

## 2022-07-08 NOTE — Progress Notes (Signed)
BP (!) 158/97   Pulse 77   Ht '6\' 4"'$  (1.93 m)   Wt (!) 353 lb (160.1 kg)   SpO2 96%   BMI 42.97 kg/m    Subjective:   Patient ID: Derek Perez, male    DOB: 18-Sep-1980, 42 y.o.   MRN: [id number]  HPI: Derek Perez is a 42 y.o. male presenting on 07/08/2022 for Medical Management of Chronic Issues (Would like FMLA forms completed to help take care of his mom, wife and daughter.)   HPI Anxiety depression Patient is followed today for anxiety and depression for himself and discuss FMLA.  He is dealing with a lot in his family health issues.  He helps take care of his mother who has a lot of health issues and also his wife who has severe Crohn's disease and has had a lot of issues.  Because of dealing with this he was already having a lot of stress and not sleeping super well but then over the past couple weeks they found out that their daughter is having suicidal ideations and severely depressed and they have been up with her worrying about her just caused a lot of stress for him.  He feels very irritable and has noticed that irritability coming out of work and around him.  He is not sleeping well because of anxiety and mental thoughts he just feels like he is not taking care of himself as well.  He is also forgetting to take his old blood pressure medicines she is caused him to feel worse as well.  He is working towards counseling at his workplace but does want something to help with depression wants some time off to recuperate from work pression.  He denies any suicidal ideations or himself.    07/08/2022    2:11 PM 03/18/2022   11:44 AM 07/30/2021   10:32 AM 01/13/2021    9:02 AM 02/28/2020    3:55 PM  Depression screen PHQ 2/9  Decreased Interest 1 0 0 0 0  Down, Depressed, Hopeless 1 0 0 0 0  PHQ - 2 Score 2 0 0 0 0  Altered sleeping 1 0 0 0   Tired, decreased energy 2 1 0 0   Change in appetite 1 0 0 0   Feeling bad or failure about yourself  1 0 0 0   Trouble concentrating 0 0  0 0   Moving slowly or fidgety/restless 0 0 0 0   Suicidal thoughts 0 0 0 0   PHQ-9 Score 7 1 0 0   Difficult doing work/chores Somewhat difficult Not difficult at all        Relevant past medical, surgical, family and social history reviewed and updated as indicated. Interim medical history since our last visit reviewed. Allergies and medications reviewed and updated.  Review of Systems  Constitutional:  Negative for chills and fever.  Eyes:  Negative for visual disturbance.  Respiratory:  Negative for shortness of breath and wheezing.   Cardiovascular:  Negative for chest pain and leg swelling.  Skin:  Negative for rash.  Psychiatric/Behavioral:  Positive for dysphoric mood and sleep disturbance. Negative for self-injury and suicidal ideas. The patient is nervous/anxious.   All other systems reviewed and are negative.   Per HPI unless specifically indicated above   Allergies as of 07/08/2022   No Known Allergies      Medication List        Accurate as of July 08, 2022  2:49 PM.  If you have any questions, ask your nurse or doctor.          acetaminophen 500 MG tablet Commonly known as: TYLENOL Take 1 tablet (500 mg total) by mouth every 6 (six) hours as needed.   atorvastatin 20 MG tablet Commonly known as: LIPITOR Take 1 tablet (20 mg total) by mouth daily.   losartan 50 MG tablet Commonly known as: COZAAR Take 1 tablet (50 mg total) by mouth daily.   sertraline 50 MG tablet Commonly known as: Zoloft Take 1 tablet (50 mg total) by mouth daily. Started by: Fransisca Kaufmann Skylor Hughson, MD         Objective:   BP (!) 158/97   Pulse 77   Ht '6\' 4"'$  (1.93 m)   Wt (!) 353 lb (160.1 kg)   SpO2 96%   BMI 42.97 kg/m   Wt Readings from Last 3 Encounters:  07/08/22 (!) 353 lb (160.1 kg)  03/18/22 (!) 353 lb (160.1 kg)  07/30/21 (!) 356 lb (161.5 kg)    Physical Exam Vitals and nursing note reviewed.  Constitutional:      General: He is not in acute distress.     Appearance: He is well-developed. He is not diaphoretic.  Eyes:     General: No scleral icterus.    Conjunctiva/sclera: Conjunctivae normal.  Musculoskeletal:     Cervical back: Neck supple.  Skin:    General: Skin is warm and dry.     Findings: No rash.  Neurological:     Mental Status: He is alert and oriented to person, place, and time.     Coordination: Coordination normal.  Psychiatric:        Mood and Affect: Mood is anxious and depressed.        Behavior: Behavior normal.        Thought Content: Thought content does not include suicidal ideation. Thought content does not include suicidal plan.       Assessment & Plan:   Problem List Items Addressed This Visit   None Visit Diagnoses     Depression, major, single episode, moderate ([place])    -  Primary   Relevant Medications   sertraline (ZOLOFT) 50 MG tablet       Will start patient on Zoloft, have him come back in about 3 to 4 weeks and we will write him out for FMLA until after the appointment and discuss it then.  He is going to try counseling at his workplace. Follow up plan: Return in about 3 weeks (around 07/29/2022), or if symptoms worsen or fail to improve, for Depression .  Counseling provided for all of the vaccine components No orders of the defined types were placed in this encounter.   Caryl Pina, MD [facility] 07/08/2022, 2:49 PM

## 2022-07-13 ENCOUNTER — Telehealth: Payer: Self-pay | Admitting: Family Medicine

## 2022-07-13 NOTE — Telephone Encounter (Signed)
Original fax sent today via email to fax # [phone] confirmation received that 13 pages sent w/o OV notes. Resent now w/ OV notes through records release to the above fax #.

## 2022-07-13 NOTE — Telephone Encounter (Signed)
[facility] Rep called stating that they received patients disability forms from [place] via fax on 07/07/22 but says there should have been 4 pages total and they only received page 2 and page 4 so they are missing pages 1 and 3. Also they requested patients most recent OV notes from 07/05/2022 to present that they are still waiting to receive.   Can fax to [phone] Can call Rep at [phone] ext [phone]

## 2022-07-23 ENCOUNTER — Telehealth: Payer: Self-pay | Admitting: Family Medicine

## 2022-07-23 NOTE — Telephone Encounter (Signed)
Talked w/ pt, form on PCP's desk hoping to have this back on Monday.

## 2022-07-27 ENCOUNTER — Encounter: Payer: Self-pay | Admitting: Family Medicine

## 2022-07-27 NOTE — Telephone Encounter (Signed)
Pt aware ppw faxed back this morning, received back from PCP's desk this morning.

## 2022-08-06 ENCOUNTER — Ambulatory Visit (INDEPENDENT_AMBULATORY_CARE_PROVIDER_SITE_OTHER): Payer: BC Managed Care – PPO | Admitting: Family Medicine

## 2022-08-06 VITALS — BP 135/80 | HR 74 | Ht 76.0 in | Wt 359.0 lb

## 2022-08-06 DIAGNOSIS — F321 Major depressive disorder, single episode, moderate: Secondary | ICD-10-CM

## 2022-08-06 MED ORDER — TRAZODONE HCL 50 MG PO TABS: 25.0000 mg | ORAL_TABLET | Freq: Every evening | ORAL | 3 refills | Status: DC | PRN

## 2022-08-06 NOTE — Progress Notes (Signed)
BP 135/80   Pulse 74   Ht 6\' 4"  (1.93 m)   Wt (!) 359 lb (162.8 kg)   SpO2 97%   BMI 43.70 kg/m    Subjective:   Patient ID: Derek Perez, male    DOB: 11/23/1980, 42 y.o.   MRN: [id number]  HPI: Derek Perez is a 42 y.o. male presenting on 08/06/2022 for Medical Management of Chronic Issues and Depression (Started Sertraline. Helping mood. C/O sleep. Sometimes falls asleep right away but sometimes does not. Pt takes the medication at night.)   HPI Depression recheck Patient started 1 month ago Zoloft.  Patient feels like his anxiety and depression is a lot better, things have calmed down with his mother and that health mental illness of his daughter.  He is starting to go back to work soon.  He says his only real issue is just sleeping at night sometimes.  Some nights he has issues and some nights he does not.  He does admit that his cell phone is part of the issue that is going to try and reduce that in the evenings and reduce television in the evenings.  He denies any suicidal ideations or thoughts of himself.    08/06/2022    9:12 AM 07/08/2022    2:11 PM 03/18/2022   11:44 AM 07/30/2021   10:32 AM 01/13/2021    9:02 AM  Depression screen PHQ 2/9  Decreased Interest 1 1 0 0 0  Down, Depressed, Hopeless 0 1 0 0 0  PHQ - 2 Score 1 2 0 0 0  Altered sleeping 1 1 0 0 0  Tired, decreased energy 1 2 1  0 0  Change in appetite 0 1 0 0 0  Feeling bad or failure about yourself  0 1 0 0 0  Trouble concentrating 0 0 0 0 0  Moving slowly or fidgety/restless 0 0 0 0 0  Suicidal thoughts 0 0 0 0 0  PHQ-9 Score 3 7 1  0 0  Difficult doing work/chores Somewhat difficult Somewhat difficult Not difficult at all       Relevant past medical, surgical, family and social history reviewed and updated as indicated. Interim medical history since our last visit reviewed. Allergies and medications reviewed and updated.  Review of Systems  Constitutional:  Negative for chills and fever.  Eyes:   Negative for visual disturbance.  Respiratory:  Negative for shortness of breath and wheezing.   Cardiovascular:  Negative for chest pain and leg swelling.  Musculoskeletal:  Negative for back pain and gait problem.  Skin:  Negative for rash.  Neurological:  Negative for dizziness and weakness.  Psychiatric/Behavioral:  Positive for dysphoric mood and sleep disturbance. Negative for decreased concentration, self-injury and suicidal ideas. The patient is nervous/anxious.   All other systems reviewed and are negative.   Per HPI unless specifically indicated above   Allergies as of 08/06/2022   No Known Allergies      Medication List        Accurate as of August 06, 2022  9:33 AM. If you have any questions, ask your nurse or doctor.          acetaminophen 500 MG tablet Commonly known as: TYLENOL Take 1 tablet (500 mg total) by mouth every 6 (six) hours as needed.   atorvastatin 20 MG tablet Commonly known as: LIPITOR Take 1 tablet (20 mg total) by mouth daily.   losartan 50 MG tablet Commonly known as: COZAAR Take 1 tablet (50  mg total) by mouth daily.   sertraline 50 MG tablet Commonly known as: Zoloft Take 1 tablet (50 mg total) by mouth daily.   traZODone 50 MG tablet Commonly known as: DESYREL Take 0.5-1 tablets (25-50 mg total) by mouth at bedtime as needed for sleep. Started by: Elige Radon Kiyonna Tortorelli, MD         Objective:   BP 135/80   Pulse 74   Ht 6\' 4"  (1.93 m)   Wt (!) 359 lb (162.8 kg)   SpO2 97%   BMI 43.70 kg/m   Wt Readings from Last 3 Encounters:  08/06/22 (!) 359 lb (162.8 kg)  07/08/22 (!) 353 lb (160.1 kg)  03/18/22 (!) 353 lb (160.1 kg)    Physical Exam Vitals and nursing note reviewed.  Constitutional:      General: He is not in acute distress.    Appearance: He is well-developed. He is not diaphoretic.  Eyes:     General: No scleral icterus.    Conjunctiva/sclera: Conjunctivae normal.  Neck:     Thyroid: No thyromegaly.   Cardiovascular:     Rate and Rhythm: Normal rate and regular rhythm.     Heart sounds: Normal heart sounds. No murmur heard. Pulmonary:     Effort: Pulmonary effort is normal. No respiratory distress.     Breath sounds: Normal breath sounds. No wheezing.  Musculoskeletal:     Cervical back: Neck supple.  Lymphadenopathy:     Cervical: No cervical adenopathy.  Skin:    General: Skin is warm and dry.     Findings: No rash.  Neurological:     Mental Status: He is alert and oriented to person, place, and time.     Coordination: Coordination normal.  Psychiatric:        Mood and Affect: Mood is anxious. Mood is not depressed.        Behavior: Behavior normal.        Thought Content: Thought content does not include suicidal ideation. Thought content does not include suicidal plan.       Assessment & Plan:   Problem List Items Addressed This Visit   None Visit Diagnoses     Depression, major, single episode, moderate    -  Primary   Relevant Medications   traZODone (DESYREL) 50 MG tablet     Will add trazodone, continue Zoloft.  Follow up plan: Return if symptoms worsen or fail to improve, for Has appointment already made, follow-up then.  Counseling provided for all of the vaccine components No orders of the defined types were placed in this encounter.   Arville Care, MD [facility] 08/06/2022, 9:33 AM

## 2022-08-09 ENCOUNTER — Telehealth: Payer: Self-pay | Admitting: Family Medicine

## 2022-08-09 NOTE — Telephone Encounter (Signed)
I spoke to pt and he just needs a return to work note from [place]. He has FMLA for caring for a family member. Is it ok for him to return to work due to his medical condition and to write a letter?

## 2022-08-09 NOTE — Telephone Encounter (Signed)
We had talked that he was ending his short-term disability because they did not cover it and so he was going to go back to work but if he needs a note to try and extend that we can especially if somebody went to the hospital.  But I had thought that he was going to try and go back because of the loss of pay

## 2022-08-09 NOTE — Telephone Encounter (Signed)
Letter printed and pt aware it is ready to be picked up.

## 2022-08-09 NOTE — Telephone Encounter (Signed)
Patient saw PCP 4/5 and said it was not discussed if he needed to go back to work today or not. He said that he will not be going in due to the fact that he had to rush someone to the hospital but wanted to confirm when PCP wanted him to go back. Please call back and advise.

## 2022-08-09 NOTE — Telephone Encounter (Signed)
Yes I am okay with him returning to work, I do know he has FMLA for covering for family member and I am fine with that but I am okay based on his current medical status for him return to work what ever day he is ready, he can return to work tomorrow or today if you would like.

## 2022-09-03 ENCOUNTER — Ambulatory Visit (INDEPENDENT_AMBULATORY_CARE_PROVIDER_SITE_OTHER): Payer: BC Managed Care – PPO | Admitting: Family Medicine

## 2022-09-03 ENCOUNTER — Encounter: Payer: Self-pay | Admitting: Family Medicine

## 2022-09-03 VITALS — BP 137/82 | HR 75 | Ht 76.0 in | Wt 359.0 lb

## 2022-09-03 DIAGNOSIS — I10 Essential (primary) hypertension: Secondary | ICD-10-CM | POA: Diagnosis not present

## 2022-09-03 DIAGNOSIS — F321 Major depressive disorder, single episode, moderate: Secondary | ICD-10-CM | POA: Insufficient documentation

## 2022-09-03 DIAGNOSIS — Z6841 Body Mass Index (BMI) 40.0 and over, adult: Secondary | ICD-10-CM

## 2022-09-03 DIAGNOSIS — E782 Mixed hyperlipidemia: Secondary | ICD-10-CM

## 2022-09-03 LAB — CMP14+EGFR
BUN: 19 mg/dL (ref 6–24)
Chloride: 106 mmol/L (ref 96–106)
Glucose: 98 mg/dL (ref 70–99)
Potassium: 5.2 mmol/L (ref 3.5–5.2)
Sodium: 142 mmol/L (ref 134–144)

## 2022-09-03 LAB — LIPID PANEL

## 2022-09-03 MED ORDER — SERTRALINE HCL 100 MG PO TABS: 100.0000 mg | ORAL_TABLET | Freq: Every day | ORAL | 3 refills | Status: DC

## 2022-09-03 NOTE — Progress Notes (Signed)
BP 137/82   Pulse 75   Ht 6\' 4"  (1.93 m)   Wt (!) 359 lb (162.8 kg)   SpO2 96%   BMI 43.70 kg/m    Subjective:   Patient ID: Derek Perez, male    DOB: 1981/02/09, 42 y.o.   MRN: [id number]  HPI: Derek Perez is a 42 y.o. male presenting on 09/03/2022 for Medical Management of Chronic Issues and Depression (Not sure if Zoloft is helping- some better but not as much as he would like)   HPI Anxiety depression recheck Patient is coming in today for anxiety depression recheck.  He is currently trazodone at night.  He says he still.  He is able to fall asleep and wake up to urinate and will be done and so he will stay awake watching TV.  He says he still has some irritability during the daytime and per his wife she agrees with this.  This is hard to tell if it helped with but has not had any side effects from it.  Hypertension Patient is currently on losartan, and their blood pressure today is 137/82. Patient denies any lightheadedness or dizziness. Patient denies headaches, blurred vision, chest pains, shortness of breath, or weakness. Denies any side effects from medication and is content with current medication.   Hyperlipidemia Patient is coming in for recheck of his hyperlipidemia. The patient is currently taking Lipitor. They deny any issues with myalgias or history of liver damage from it. They deny any focal numbness or weakness or chest pain.   Obesity recheck Patient's hypertension and hyperlipidemia are more complicated by the patient's morbid obesity.  Discussed weight loss and lifestyle modification and exercise with the patient.   Relevant past medical, surgical, family and social history reviewed and updated as indicated. Interim medical history since our last visit reviewed. Allergies and medications reviewed and updated.  Review of Systems  Constitutional:  Negative for chills and fever.  Eyes:  Negative for visual disturbance.  Respiratory:  Negative for  shortness of breath and wheezing.   Cardiovascular:  Negative for chest pain and leg swelling.  Musculoskeletal:  Negative for back pain and gait problem.  Skin:  Negative for rash.  Neurological:  Negative for dizziness, weakness and numbness.  Psychiatric/Behavioral:  Positive for dysphoric mood and sleep disturbance. Negative for self-injury and suicidal ideas. The patient is nervous/anxious.   All other systems reviewed and are negative.   Per HPI unless specifically indicated above   Allergies as of 09/03/2022   No Known Allergies      Medication List        Accurate as of Sep 03, 2022  9:12 AM. If you have any questions, ask your nurse or doctor.          acetaminophen 500 MG tablet Commonly known as: TYLENOL Take 1 tablet (500 mg total) by mouth every 6 (six) hours as needed.   atorvastatin 20 MG tablet Commonly known as: LIPITOR Take 1 tablet (20 mg total) by mouth daily.   losartan 50 MG tablet Commonly known as: COZAAR Take 1 tablet (50 mg total) by mouth daily.   sertraline 100 MG tablet Commonly known as: Zoloft Take 1 tablet (100 mg total) by mouth daily. What changed:  medication strength how much to take Changed by: Nils Pyle, MD   traZODone 50 MG tablet Commonly known as: DESYREL Take 0.5-1 tablets (25-50 mg total) by mouth at bedtime as needed for sleep.  Objective:   BP 137/82   Pulse 75   Ht 6\' 4"  (1.93 m)   Wt (!) 359 lb (162.8 kg)   SpO2 96%   BMI 43.70 kg/m   Wt Readings from Last 3 Encounters:  09/03/22 (!) 359 lb (162.8 kg)  08/06/22 (!) 359 lb (162.8 kg)  07/08/22 (!) 353 lb (160.1 kg)    Physical Exam Vitals and nursing note reviewed.  Constitutional:      General: He is not in acute distress.    Appearance: He is well-developed. He is not diaphoretic.  Eyes:     General: No scleral icterus.    Conjunctiva/sclera: Conjunctivae normal.  Neck:     Thyroid: No thyromegaly.  Cardiovascular:     Rate  and Rhythm: Normal rate and regular rhythm.     Heart sounds: Normal heart sounds. No murmur heard. Pulmonary:     Effort: Pulmonary effort is normal. No respiratory distress.     Breath sounds: Normal breath sounds. No wheezing.  Musculoskeletal:        General: No swelling. Normal range of motion.     Cervical back: Neck supple.  Lymphadenopathy:     Cervical: No cervical adenopathy.  Skin:    General: Skin is warm and dry.     Findings: No rash.  Neurological:     Mental Status: He is alert and oriented to person, place, and time.     Coordination: Coordination normal.  Psychiatric:        Behavior: Behavior normal.       Assessment & Plan:   Problem List Items Addressed This Visit       Cardiovascular and Mediastinum   Hypertension - Primary   Relevant Orders   CBC with Differential/Platelet   CMP14+EGFR   Lipid panel     Other   Mixed hyperlipidemia   Relevant Orders   CBC with Differential/Platelet   CMP14+EGFR   Lipid panel   Morbid obesity (HCC)   Depression, major, single episode, moderate (HCC)   Relevant Medications   sertraline (ZOLOFT) 100 MG tablet   Other Relevant Orders   CBC with Differential/Platelet    Will increase the Zoloft to 100 mg to see if helps more.  Also instructed him that he likely needs to turn off the TV at night Follow up plan: Return if symptoms worsen or fail to improve, for 2 to 34-month anxiety depression recheck.  Counseling provided for all of the vaccine components Orders Placed This Encounter  Procedures   CBC with Differential/Platelet   CMP14+EGFR   Lipid panel    Arville Care, MD [facility] 09/03/2022, 9:12 AM

## 2022-09-04 LAB — CBC WITH DIFFERENTIAL/PLATELET
Basophils Absolute: 0 10*3/uL (ref 0.0–0.2)
Basos: 0 %
EOS (ABSOLUTE): 0.1 10*3/uL (ref 0.0–0.4)
Eos: 3 %
Hematocrit: 44 % (ref 37.5–51.0)
Hemoglobin: 14.4 g/dL (ref 13.0–17.7)
Immature Grans (Abs): 0 10*3/uL (ref 0.0–0.1)
Immature Granulocytes: 0 %
Lymphocytes Absolute: 1.1 10*3/uL (ref 0.7–3.1)
Lymphs: 23 %
MCH: 27.6 pg (ref 26.6–33.0)
MCHC: 32.7 g/dL (ref 31.5–35.7)
MCV: 84 fL (ref 79–97)
Monocytes Absolute: 0.5 10*3/uL (ref 0.1–0.9)
Monocytes: 10 %
Neutrophils Absolute: 3.1 10*3/uL (ref 1.4–7.0)
Neutrophils: 64 %
Platelets: 172 10*3/uL (ref 150–450)
RBC: 5.22 x10E6/uL (ref 4.14–5.80)
RDW: 13.5 % (ref 11.6–15.4)
WBC: 4.9 10*3/uL (ref 3.4–10.8)

## 2022-09-04 LAB — CMP14+EGFR
AST: 23 IU/L (ref 0–40)
Albumin/Globulin Ratio: 1.6 (ref 1.2–2.2)
Albumin: 4.1 g/dL (ref 4.1–5.1)
Alkaline Phosphatase: 75 IU/L (ref 44–121)
Bilirubin Total: 0.5 mg/dL (ref 0.0–1.2)
CO2: 24 mmol/L (ref 20–29)
Calcium: 9.2 mg/dL (ref 8.7–10.2)
Creatinine, Ser: 1.29 mg/dL — ABNORMAL HIGH (ref 0.76–1.27)
Total Protein: 6.7 g/dL (ref 6.0–8.5)
eGFR: 71 mL/min/{1.73_m2} (ref >59)

## 2022-09-04 LAB — LIPID PANEL
Chol/HDL Ratio: 3 ratio (ref 0.0–5.0)
Cholesterol, Total: 136 mg/dL (ref 100–199)
Triglycerides: 54 mg/dL (ref 0–149)

## 2022-09-16 ENCOUNTER — Ambulatory Visit: Payer: BC Managed Care – PPO | Admitting: Family Medicine

## 2022-12-06 ENCOUNTER — Ambulatory Visit: Payer: BC Managed Care – PPO | Admitting: Family Medicine

## 2022-12-23 ENCOUNTER — Ambulatory Visit: Payer: BC Managed Care – PPO | Admitting: Family Medicine

## 2023-02-08 ENCOUNTER — Telehealth: Payer: Self-pay | Admitting: Family Medicine

## 2023-02-09 NOTE — Telephone Encounter (Signed)
Pt made aware that an in office appt is needed.  R/S to 10/14 at 4:10pm. Due to work schedule and not having enough FMLA. Works 8-4 M-F.

## 2023-02-09 NOTE — Telephone Encounter (Signed)
He is due for blood work so he needs to come in.  His kidney function was a little off last time and we need to at least recheck that along with other blood work.

## 2023-02-11 ENCOUNTER — Ambulatory Visit: Payer: BC Managed Care – PPO | Admitting: Family Medicine

## 2023-02-14 ENCOUNTER — Encounter: Payer: Self-pay | Admitting: Family Medicine

## 2023-02-14 ENCOUNTER — Ambulatory Visit: Payer: BC Managed Care – PPO | Admitting: Family Medicine

## 2023-02-14 VITALS — BP 133/79 | HR 90 | Ht 76.0 in | Wt 339.0 lb

## 2023-02-14 DIAGNOSIS — E782 Mixed hyperlipidemia: Secondary | ICD-10-CM | POA: Diagnosis not present

## 2023-02-14 DIAGNOSIS — I10 Essential (primary) hypertension: Secondary | ICD-10-CM

## 2023-02-14 DIAGNOSIS — F321 Major depressive disorder, single episode, moderate: Secondary | ICD-10-CM

## 2023-02-14 MED ORDER — LOSARTAN POTASSIUM 50 MG PO TABS: 50.0000 mg | ORAL_TABLET | Freq: Every day | ORAL | 3 refills | Status: DC

## 2023-02-14 MED ORDER — TRAZODONE HCL 50 MG PO TABS
25.0000 mg | ORAL_TABLET | Freq: Every evening | ORAL | 3 refills | Status: DC | PRN
Start: 2023-02-14 — End: 2024-01-16

## 2023-02-14 MED ORDER — ATORVASTATIN CALCIUM 20 MG PO TABS: 20.0000 mg | ORAL_TABLET | Freq: Every day | ORAL | 3 refills | Status: DC

## 2023-02-14 MED ORDER — SERTRALINE HCL 100 MG PO TABS: 100.0000 mg | ORAL_TABLET | Freq: Every day | ORAL | 3 refills | Status: DC

## 2023-02-14 NOTE — Progress Notes (Signed)
BP 133/79   Pulse 90   Ht 6\' 4"  (1.93 m)   Wt (!) 339 lb (153.8 kg)   SpO2 97%   BMI 41.26 kg/m    Subjective:   Patient ID: Derek Perez, male    DOB: 1981/03/19, 42 y.o.   MRN: [id number]  HPI: Derek Perez is a 42 y.o. male presenting on 02/14/2023 for Medical Management of Chronic Issues, Hypertension, and Depression   HPI Anxiety depression recheck Patient is currently taking sertraline and trazodone for anxiety and depression.  He feels like he is doing well and the medication is helping.  He is still dealing with a lot with the sickness of his wife and mental health with his daughter but he feels like he is doing really well.  He denies any suicidal ideations or thoughts of hurting himself.    02/14/2023    4:16 PM 09/03/2022    8:54 AM 08/06/2022    9:12 AM 07/08/2022    2:11 PM 03/18/2022   11:44 AM  Depression screen PHQ 2/9  Decreased Interest 0 1 1 1  0  Down, Depressed, Hopeless 0 0 0 1 0  PHQ - 2 Score 0 1 1 2  0  Altered sleeping 0 1 1 1  0  Tired, decreased energy 0 1 1 2 1   Change in appetite 0 0 0 1 0  Feeling bad or failure about yourself  0 0 0 1 0  Trouble concentrating 0 0 0 0 0  Moving slowly or fidgety/restless 0 0 0 0 0  Suicidal thoughts 0 0 0 0 0  PHQ-9 Score 0 3 3 7 1   Difficult doing work/chores Not difficult at all Somewhat difficult Somewhat difficult Somewhat difficult Not difficult at all    Hypertension Patient is currently on losartan, and their blood pressure today is 133/79. Patient denies any lightheadedness or dizziness. Patient denies headaches, blurred vision, chest pains, shortness of breath, or weakness. Denies any side effects from medication and is content with current medication.   Hyperlipidemia Patient is coming in for recheck of his hyperlipidemia. The patient is currently taking atorvastatin. They deny any issues with myalgias or history of liver damage from it. They deny any focal numbness or weakness or chest pain.    Relevant past medical, surgical, family and social history reviewed and updated as indicated. Interim medical history since our last visit reviewed. Allergies and medications reviewed and updated.  Review of Systems  Constitutional:  Negative for chills and fever.  Eyes:  Negative for visual disturbance.  Respiratory:  Negative for shortness of breath and wheezing.   Cardiovascular:  Negative for chest pain and leg swelling.  Musculoskeletal:  Negative for back pain and gait problem.  Skin:  Negative for rash.  Neurological:  Negative for dizziness and light-headedness.  Psychiatric/Behavioral:  Positive for dysphoric mood and sleep disturbance. Negative for self-injury and suicidal ideas. The patient is nervous/anxious.   All other systems reviewed and are negative.   Per HPI unless specifically indicated above   Allergies as of 02/14/2023   No Known Allergies      Medication List        Accurate as of February 14, 2023  4:22 PM. If you have any questions, ask your nurse or doctor.          acetaminophen 500 MG tablet Commonly known as: TYLENOL Take 1 tablet (500 mg total) by mouth every 6 (six) hours as needed.   atorvastatin 20 MG tablet Commonly known  as: LIPITOR Take 1 tablet (20 mg total) by mouth daily.   losartan 50 MG tablet Commonly known as: COZAAR Take 1 tablet (50 mg total) by mouth daily.   sertraline 100 MG tablet Commonly known as: Zoloft Take 1 tablet (100 mg total) by mouth daily.   traZODone 50 MG tablet Commonly known as: DESYREL Take 0.5-1 tablets (25-50 mg total) by mouth at bedtime as needed for sleep.         Objective:   BP 133/79   Pulse 90   Ht 6\' 4"  (1.93 m)   Wt (!) 339 lb (153.8 kg)   SpO2 97%   BMI 41.26 kg/m   Wt Readings from Last 3 Encounters:  02/14/23 (!) 339 lb (153.8 kg)  09/03/22 (!) 359 lb (162.8 kg)  08/06/22 (!) 359 lb (162.8 kg)    Physical Exam Vitals and nursing note reviewed.  Constitutional:       General: He is not in acute distress.    Appearance: He is well-developed. He is not diaphoretic.  Eyes:     General: No scleral icterus.    Conjunctiva/sclera: Conjunctivae normal.  Neck:     Thyroid: No thyromegaly.  Cardiovascular:     Rate and Rhythm: Normal rate and regular rhythm.     Heart sounds: Normal heart sounds. No murmur heard. Pulmonary:     Effort: Pulmonary effort is normal. No respiratory distress.     Breath sounds: Normal breath sounds. No wheezing.  Musculoskeletal:        General: No swelling. Normal range of motion.     Cervical back: Neck supple.  Lymphadenopathy:     Cervical: No cervical adenopathy.  Skin:    General: Skin is warm and dry.     Findings: No rash.  Neurological:     Mental Status: He is alert and oriented to person, place, and time.     Coordination: Coordination normal.  Psychiatric:        Behavior: Behavior normal.       Assessment & Plan:   Problem List Items Addressed This Visit       Cardiovascular and Mediastinum   Hypertension - Primary   Relevant Medications   atorvastatin (LIPITOR) 20 MG tablet   losartan (COZAAR) 50 MG tablet   Other Relevant Orders   CBC with Differential/Platelet   CMP14+EGFR   Lipid panel     Other   Mixed hyperlipidemia   Relevant Medications   atorvastatin (LIPITOR) 20 MG tablet   losartan (COZAAR) 50 MG tablet   Other Relevant Orders   CBC with Differential/Platelet   CMP14+EGFR   Lipid panel   Depression, major, single episode, moderate (HCC)   Relevant Medications   sertraline (ZOLOFT) 100 MG tablet   traZODone (DESYREL) 50 MG tablet   Other Relevant Orders   CBC with Differential/Platelet   CMP14+EGFR   Lipid panel    No changes to medication, will do blood work today. Follow up plan: Return in about 6 months (around 08/15/2023), or if symptoms worsen or fail to improve, for Physical exam and hypertension and hyperlipidemia recheck.  Counseling provided for all of the  vaccine components Orders Placed This Encounter  Procedures   CBC with Differential/Platelet   CMP14+EGFR   Lipid panel    Arville Care, MD [facility] 02/14/2023, 4:22 PM

## 2023-02-15 LAB — CMP14+EGFR
ALT: 14 [IU]/L (ref 0–44)
AST: 20 [IU]/L (ref 0–40)
Albumin: 4 g/dL — ABNORMAL LOW (ref 4.1–5.1)
Alkaline Phosphatase: 83 [IU]/L (ref 44–121)
BUN/Creatinine Ratio: 13 (ref 9–20)
BUN: 15 mg/dL (ref 6–24)
Bilirubin Total: 0.6 mg/dL (ref 0.0–1.2)
CO2: 19 mmol/L — ABNORMAL LOW (ref 20–29)
Calcium: 9.3 mg/dL (ref 8.7–10.2)
Chloride: 107 mmol/L — ABNORMAL HIGH (ref 96–106)
Creatinine, Ser: 1.17 mg/dL (ref 0.76–1.27)
Globulin, Total: 2.9 g/dL (ref 1.5–4.5)
Glucose: 71 mg/dL (ref 70–99)
Potassium: 4.4 mmol/L (ref 3.5–5.2)
Sodium: 144 mmol/L (ref 134–144)
Total Protein: 6.9 g/dL (ref 6.0–8.5)
eGFR: 79 mL/min/{1.73_m2} (ref >59)

## 2023-02-15 LAB — LIPID PANEL
Chol/HDL Ratio: 3 {ratio} (ref 0.0–5.0)
Cholesterol, Total: 139 mg/dL (ref 100–199)
HDL: 47 mg/dL (ref >39)
LDL Chol Calc (NIH): 77 mg/dL (ref 0–99)
Triglycerides: 76 mg/dL (ref 0–149)
VLDL Cholesterol Cal: 15 mg/dL (ref 5–40)

## 2023-02-15 LAB — CBC WITH DIFFERENTIAL/PLATELET

## 2023-03-23 ENCOUNTER — Encounter: Payer: Self-pay | Admitting: *Deleted

## 2023-08-15 ENCOUNTER — Encounter: Payer: BC Managed Care – PPO | Admitting: Family Medicine

## 2023-08-22 ENCOUNTER — Encounter: Payer: BC Managed Care – PPO | Admitting: Family Medicine

## 2023-09-29 ENCOUNTER — Encounter: Admitting: Family Medicine

## 2023-10-12 ENCOUNTER — Encounter: Admitting: Family Medicine

## 2023-12-15 ENCOUNTER — Encounter: Admitting: Family Medicine

## 2024-01-12 ENCOUNTER — Ambulatory Visit: Admitting: Family Medicine

## 2024-01-16 ENCOUNTER — Ambulatory Visit (INDEPENDENT_AMBULATORY_CARE_PROVIDER_SITE_OTHER): Admitting: Family Medicine

## 2024-01-16 VITALS — BP 137/78 | HR 98 | Ht 76.0 in | Wt 330.0 lb

## 2024-01-16 DIAGNOSIS — I10 Essential (primary) hypertension: Secondary | ICD-10-CM | POA: Diagnosis not present

## 2024-01-16 DIAGNOSIS — Z0000 Encounter for general adult medical examination without abnormal findings: Secondary | ICD-10-CM | POA: Diagnosis not present

## 2024-01-16 DIAGNOSIS — F321 Major depressive disorder, single episode, moderate: Secondary | ICD-10-CM

## 2024-01-16 DIAGNOSIS — Z0001 Encounter for general adult medical examination with abnormal findings: Secondary | ICD-10-CM

## 2024-01-16 DIAGNOSIS — E782 Mixed hyperlipidemia: Secondary | ICD-10-CM | POA: Diagnosis not present

## 2024-01-16 MED ORDER — ATORVASTATIN CALCIUM 20 MG PO TABS
20.0000 mg | ORAL_TABLET | Freq: Every day | ORAL | 3 refills | Status: AC
Start: 2024-01-16 — End: ?

## 2024-01-16 MED ORDER — TRAZODONE HCL 50 MG PO TABS: 25.0000 mg | ORAL_TABLET | Freq: Every evening | ORAL | 3 refills | Status: AC | PRN

## 2024-01-16 MED ORDER — LOSARTAN POTASSIUM 50 MG PO TABS
50.0000 mg | ORAL_TABLET | Freq: Every day | ORAL | 3 refills | Status: AC
Start: 2024-01-16 — End: ?

## 2024-01-16 MED ORDER — SERTRALINE HCL 100 MG PO TABS: 100.0000 mg | ORAL_TABLET | Freq: Every day | ORAL | 3 refills | Status: AC

## 2024-01-16 NOTE — Progress Notes (Signed)
 BP 137/78   Pulse 98   Ht 6' 4 (1.93 m)   Wt (!) 330 lb (149.7 kg)   SpO2 96%   BMI 40.17 kg/m    Subjective:   Patient ID: Derek Perez, male    DOB: [birth year], 43 y.o.   MRN: [id number]  HPI: Derek Perez is a 43 y.o. male presenting on 01/16/2024 for Medical Management of Chronic Issues (CPE), Hyperlipidemia, Hypertension, and Depression   Discussed the use of AI scribe software for clinical note transcription with the patient, who gave verbal consent to proceed.  History of Present Illness   Derek Perez is a 43 year old male who presents for a routine physical exam.  His blood pressure has been stable, with a recent reading of 137/78 mmHg. He continues to take losartan  for hypertension without any side effects.  He is on a daily cholesterol medication, which he takes without any reported side effects.  For anxiety and sleep issues, he has been prescribed sertraline  and trazodone . He takes these medications on an as-needed basis during 'bad phases', which he finds effective, and he experiences no side effects when he does take them.  He mentions occasional swelling in his leg, which is the one he previously broke. The swelling is intermittent and not present at the time of the visit.  He works in [employer] and describes his job as 'a job'. No side effects from medications, pain in his kidneys, or stomach.          Relevant past medical, surgical, family and social history reviewed and updated as indicated. Interim medical history since our last visit reviewed. Allergies and medications reviewed and updated.  Review of Systems  Constitutional:  Negative for chills and fever.  HENT:  Negative for ear pain and tinnitus.   Eyes:  Negative for pain and visual disturbance.  Respiratory:  Negative for cough, shortness of breath and wheezing.   Cardiovascular:  Negative for chest pain, palpitations and leg swelling.  Gastrointestinal:  Negative for abdominal  pain, blood in stool, constipation and diarrhea.  Genitourinary:  Negative for dysuria and hematuria.  Musculoskeletal:  Negative for back pain, gait problem and myalgias.  Skin:  Negative for rash.  Neurological:  Negative for dizziness, weakness, light-headedness and headaches.  Psychiatric/Behavioral:  Negative for suicidal ideas.   All other systems reviewed and are negative.   Per HPI unless specifically indicated above   Allergies as of 01/16/2024   No Known Allergies      Medication List        Accurate as of January 16, 2024  1:34 PM. If you have any questions, ask your nurse or doctor.          acetaminophen  500 MG tablet Commonly known as: TYLENOL  Take 1 tablet (500 mg total) by mouth every 6 (six) hours as needed.   atorvastatin  20 MG tablet Commonly known as: LIPITOR Take 1 tablet (20 mg total) by mouth daily.   losartan  50 MG tablet Commonly known as: COZAAR  Take 1 tablet (50 mg total) by mouth daily.   sertraline  100 MG tablet Commonly known as: Zoloft  Take 1 tablet (100 mg total) by mouth daily.   traZODone  50 MG tablet Commonly known as: DESYREL  Take 0.5-1 tablets (25-50 mg total) by mouth at bedtime as needed for sleep.         Objective:   BP 137/78   Pulse 98   Ht 6' 4 (1.93 m)   Wt (!) 330  lb (149.7 kg)   SpO2 96%   BMI 40.17 kg/m   Wt Readings from Last 3 Encounters:  01/16/24 (!) 330 lb (149.7 kg)  02/14/23 (!) 339 lb (153.8 kg)  09/03/22 (!) 359 lb (162.8 kg)    Physical Exam Physical Exam   VITALS: BP- 137/78 NECK: Thyroid  normal. CHEST: Lungs clear to auscultation bilaterally. CARDIOVASCULAR: Regular rate and rhythm, no murmurs. NEUROLOGICAL: Normal reflexes.         Assessment & Plan:   Problem List Items Addressed This Visit       Cardiovascular and Mediastinum   Hypertension   Relevant Medications   atorvastatin  (LIPITOR) 20 MG tablet   losartan  (COZAAR ) 50 MG tablet   Other Relevant Orders   CBC  with Differential/Platelet     Other   Mixed hyperlipidemia   Relevant Medications   atorvastatin  (LIPITOR) 20 MG tablet   losartan  (COZAAR ) 50 MG tablet   Other Relevant Orders   CMP14+EGFR   CBC with Differential/Platelet   Lipid panel   Morbid obesity (HCC)   Depression, major, single episode, moderate (HCC)   Relevant Medications   sertraline  (ZOLOFT ) 100 MG tablet   traZODone  (DESYREL ) 50 MG tablet   Other Visit Diagnoses       Physical exam    -  Primary   Relevant Orders   CMP14+EGFR   CBC with Differential/Platelet   Lipid panel          Adult Wellness Visit Routine visit with no acute issues. - Perform blood work.  Essential hypertension Blood pressure 137/78 mmHg, controlled on losartan . - Continue losartan . - Monitor blood pressure at home.  Mixed hyperlipidemia Stable on current cholesterol medication.  Depression and Anxiety Effective management with intermittent sertraline  and trazodone .  General Health Maintenance Discussed flu vaccination status. - Check flu vaccination status with pharmacy. - Obtain flu vaccine if not already administered.          Follow up plan: Return in about 6 months (around 07/15/2024), or if symptoms worsen or fail to improve, for Hypertension and hyperlipidemia.  Counseling provided for all of the vaccine components Orders Placed This Encounter  Procedures   CMP14+EGFR   CBC with Differential/Platelet   Lipid panel    Fonda Levins, MD [facility] 01/16/2024, 1:34 PM

## 2024-01-17 LAB — CMP14+EGFR
ALT: 18 IU/L (ref 0–44)
AST: 20 IU/L (ref 0–40)
Albumin: 4.2 g/dL (ref 4.1–5.1)
Alkaline Phosphatase: 75 IU/L (ref 47–123)
BUN/Creatinine Ratio: 12 (ref 9–20)
BUN: 14 mg/dL (ref 6–24)
Bilirubin Total: 0.4 mg/dL (ref 0.0–1.2)
CO2: 25 mmol/L (ref 20–29)
Calcium: 9.4 mg/dL (ref 8.7–10.2)
Chloride: 107 mmol/L — ABNORMAL HIGH (ref 96–106)
Creatinine, Ser: 1.2 mg/dL (ref 0.76–1.27)
Globulin, Total: 2.7 g/dL (ref 1.5–4.5)
Glucose: 83 mg/dL (ref 70–99)
Potassium: 4.6 mmol/L (ref 3.5–5.2)
Sodium: 144 mmol/L (ref 134–144)
Total Protein: 6.9 g/dL (ref 6.0–8.5)
eGFR: 76 mL/min/1.73 (ref >59)

## 2024-01-17 LAB — CBC WITH DIFFERENTIAL/PLATELET
Basophils Absolute: 0 x10E3/uL (ref 0.0–0.2)
Basos: 1 %
EOS (ABSOLUTE): 0.2 x10E3/uL (ref 0.0–0.4)
Eos: 4 %
Hematocrit: 45.1 % (ref 37.5–51.0)
Hemoglobin: 14.1 g/dL (ref 13.0–17.7)
Immature Grans (Abs): 0 x10E3/uL (ref 0.0–0.1)
Immature Granulocytes: 0 %
Lymphocytes Absolute: 1.2 x10E3/uL (ref 0.7–3.1)
Lymphs: 20 %
MCH: 26.9 pg (ref 26.6–33.0)
MCHC: 31.3 g/dL — ABNORMAL LOW (ref 31.5–35.7)
MCV: 86 fL (ref 79–97)
Monocytes Absolute: 0.6 x10E3/uL (ref 0.1–0.9)
Monocytes: 10 %
Neutrophils Absolute: 4.1 x10E3/uL (ref 1.4–7.0)
Neutrophils: 65 %
Platelets: 205 x10E3/uL (ref 150–450)
RBC: 5.25 x10E6/uL (ref 4.14–5.80)
RDW: 12.7 % (ref 11.6–15.4)
WBC: 6.2 x10E3/uL (ref 3.4–10.8)

## 2024-01-17 LAB — LIPID PANEL
Chol/HDL Ratio: 3 ratio (ref 0.0–5.0)
Cholesterol, Total: 143 mg/dL (ref 100–199)
HDL: 48 mg/dL (ref >39)
LDL Chol Calc (NIH): 83 mg/dL (ref 0–99)
Triglycerides: 60 mg/dL (ref 0–149)
VLDL Cholesterol Cal: 12 mg/dL (ref 5–40)

## 2024-01-20 ENCOUNTER — Ambulatory Visit: Payer: Self-pay | Admitting: Family Medicine

## 2024-07-16 ENCOUNTER — Ambulatory Visit: Payer: Self-pay | Admitting: Family Medicine
